# Patient Record
Sex: Female | Born: 1975
Health system: Southern US, Community
[De-identification: ages and names within clinical notes are randomized; demographics above are authoritative.]

## PROBLEM LIST (undated history)

## (undated) DIAGNOSIS — G4726 Circadian rhythm sleep disorder, shift work type: Secondary | ICD-10-CM

## (undated) DIAGNOSIS — M797 Fibromyalgia: Secondary | ICD-10-CM

## (undated) DIAGNOSIS — Z87440 Personal history of urinary (tract) infections: Secondary | ICD-10-CM

## (undated) DIAGNOSIS — R7611 Nonspecific reaction to tuberculin skin test without active tuberculosis: Secondary | ICD-10-CM

## (undated) DIAGNOSIS — E785 Hyperlipidemia, unspecified: Secondary | ICD-10-CM

## (undated) DIAGNOSIS — K760 Fatty (change of) liver, not elsewhere classified: Secondary | ICD-10-CM

## (undated) DIAGNOSIS — E119 Type 2 diabetes mellitus without complications: Secondary | ICD-10-CM

## (undated) DIAGNOSIS — D649 Anemia, unspecified: Secondary | ICD-10-CM

## (undated) HISTORY — DX: Anemia, unspecified: D64.9

## (undated) HISTORY — DX: Circadian rhythm sleep disorder, shift work type: G47.26

## (undated) HISTORY — PX: WISDOM TOOTH EXTRACTION: SHX21

## (undated) HISTORY — DX: Fatty (change of) liver, not elsewhere classified: K76.0

## (undated) HISTORY — DX: Personal history of urinary (tract) infections: Z87.440

## (undated) HISTORY — DX: Hyperlipidemia, unspecified: E78.5

## (undated) HISTORY — DX: Type 2 diabetes mellitus without complications: E11.9

## (undated) HISTORY — DX: Fibromyalgia: M79.7

## (undated) HISTORY — DX: Nonspecific reaction to tuberculin skin test without active tuberculosis: R76.11

---

## 2006-08-04 ENCOUNTER — Ambulatory Visit: Payer: Self-pay | Admitting: Pulmonary Disease

## 2006-08-04 LAB — CONVERTED CEMR LAB
ALT: 11 units/L (ref 0–40)
Alkaline Phosphatase: 34 units/L — ABNORMAL LOW (ref 39–117)
BUN: 4 mg/dL — ABNORMAL LOW (ref 6–23)
Basophils Absolute: 0 10*3/uL (ref 0.0–0.1)
Bilirubin, Direct: 0.2 mg/dL (ref 0.0–0.3)
Calcium: 9.2 mg/dL (ref 8.4–10.5)
Eosinophils Absolute: 0.2 10*3/uL (ref 0.0–0.6)
Eosinophils Relative: 4.5 % (ref 0.0–5.0)
GFR calc Af Amer: 126 mL/min
GFR calc non Af Amer: 104 mL/min
Ketones, ur: NEGATIVE mg/dL
Leukocytes, UA: NEGATIVE
MCHC: 33.7 g/dL (ref 30.0–36.0)
MCV: 85.4 fL (ref 78.0–100.0)
Monocytes Relative: 7 % (ref 3.0–11.0)
Platelets: 324 10*3/uL (ref 150–400)
Potassium: 4.1 meq/L (ref 3.5–5.1)
RBC: 4.07 M/uL (ref 3.87–5.11)
Total CHOL/HDL Ratio: 2.6
Triglycerides: 57 mg/dL (ref 0–149)
Urine Glucose: NEGATIVE mg/dL
WBC: 4.7 10*3/uL (ref 4.5–10.5)
pH: 6 (ref 5.0–8.0)

## 2007-03-05 ENCOUNTER — Emergency Department (HOSPITAL_COMMUNITY): Admission: EM | Admit: 2007-03-05 | Discharge: 2007-03-06 | Payer: Self-pay | Admitting: Emergency Medicine

## 2007-03-30 ENCOUNTER — Ambulatory Visit: Payer: Self-pay | Admitting: Pulmonary Disease

## 2007-03-30 LAB — CONVERTED CEMR LAB
Crystals: NEGATIVE
Ketones, ur: NEGATIVE mg/dL
Leukocytes, UA: NEGATIVE
Mucus, UA: NEGATIVE
Specific Gravity, Urine: 1.02 (ref 1.000–1.03)
Urine Glucose: NEGATIVE mg/dL
Urobilinogen, UA: 0.2 (ref 0.0–1.0)
pH: 5.5 (ref 5.0–8.0)

## 2007-03-31 DIAGNOSIS — Z87440 Personal history of urinary (tract) infections: Secondary | ICD-10-CM | POA: Insufficient documentation

## 2007-03-31 DIAGNOSIS — IMO0001 Reserved for inherently not codable concepts without codable children: Secondary | ICD-10-CM | POA: Insufficient documentation

## 2007-08-01 HISTORY — PX: OTHER SURGICAL HISTORY: SHX169

## 2007-08-25 ENCOUNTER — Encounter (INDEPENDENT_AMBULATORY_CARE_PROVIDER_SITE_OTHER): Payer: Self-pay | Admitting: Obstetrics & Gynecology

## 2007-08-25 ENCOUNTER — Inpatient Hospital Stay (HOSPITAL_COMMUNITY): Admission: RE | Admit: 2007-08-25 | Discharge: 2007-08-27 | Payer: Self-pay | Admitting: Obstetrics and Gynecology

## 2009-03-01 ENCOUNTER — Ambulatory Visit: Payer: Self-pay | Admitting: Pulmonary Disease

## 2009-03-01 DIAGNOSIS — G4726 Circadian rhythm sleep disorder, shift work type: Secondary | ICD-10-CM | POA: Insufficient documentation

## 2009-03-03 DIAGNOSIS — Z862 Personal history of diseases of the blood and blood-forming organs and certain disorders involving the immune mechanism: Secondary | ICD-10-CM | POA: Insufficient documentation

## 2009-03-03 DIAGNOSIS — R7309 Other abnormal glucose: Secondary | ICD-10-CM | POA: Insufficient documentation

## 2009-03-03 LAB — CONVERTED CEMR LAB
AST: 16 units/L (ref 0–37)
Albumin: 4.5 g/dL (ref 3.5–5.2)
Alkaline Phosphatase: 60 units/L (ref 39–117)
BUN: 10 mg/dL (ref 6–23)
Basophils Absolute: 0 10*3/uL (ref 0.0–0.1)
Basophils Relative: 0.5 % (ref 0.0–3.0)
CO2: 27 meq/L (ref 19–32)
Chloride: 104 meq/L (ref 96–112)
Cholesterol: 138 mg/dL (ref 0–200)
Creatinine, Ser: 0.8 mg/dL (ref 0.4–1.2)
Glucose, Bld: 95 mg/dL (ref 70–99)
HCT: 38 % (ref 36.0–46.0)
LDL Cholesterol: 76 mg/dL (ref 0–99)
Lymphocytes Relative: 41.6 % (ref 12.0–46.0)
MCHC: 32.8 g/dL (ref 30.0–36.0)
MCV: 87.8 fL (ref 78.0–100.0)
Monocytes Relative: 5.6 % (ref 3.0–12.0)
Neutro Abs: 3.1 10*3/uL (ref 1.4–7.7)
Neutrophils Relative %: 48.4 % (ref 43.0–77.0)
Platelets: 290 10*3/uL (ref 150.0–400.0)
Potassium: 4.4 meq/L (ref 3.5–5.1)
RDW: 12.9 % (ref 11.5–14.6)
Saturation Ratios: 10.8 % — ABNORMAL LOW (ref 20.0–50.0)
Total Protein: 7.8 g/dL (ref 6.0–8.3)
Triglycerides: 28 mg/dL (ref 0.0–149.0)

## 2009-05-17 ENCOUNTER — Encounter: Admission: RE | Admit: 2009-05-17 | Discharge: 2009-05-17 | Payer: Self-pay | Admitting: Internal Medicine

## 2009-12-30 ENCOUNTER — Ambulatory Visit (HOSPITAL_COMMUNITY): Admission: RE | Admit: 2009-12-30 | Discharge: 2009-12-30 | Payer: Self-pay | Admitting: Nephrology

## 2010-06-05 ENCOUNTER — Ambulatory Visit
Admission: RE | Admit: 2010-06-05 | Discharge: 2010-06-05 | Payer: Self-pay | Source: Home / Self Care | Attending: Pulmonary Disease | Admitting: Pulmonary Disease

## 2010-06-05 ENCOUNTER — Other Ambulatory Visit: Payer: Self-pay | Admitting: Pulmonary Disease

## 2010-06-05 ENCOUNTER — Encounter: Payer: Self-pay | Admitting: Pulmonary Disease

## 2010-06-05 DIAGNOSIS — R7611 Nonspecific reaction to tuberculin skin test without active tuberculosis: Secondary | ICD-10-CM | POA: Insufficient documentation

## 2010-06-05 LAB — CBC WITH DIFFERENTIAL/PLATELET
Basophils Absolute: 0 10*3/uL (ref 0.0–0.1)
Basophils Relative: 0.6 % (ref 0.0–3.0)
Eosinophils Absolute: 0.4 10*3/uL (ref 0.0–0.7)
Eosinophils Relative: 4.8 % (ref 0.0–5.0)
HCT: 36.5 % (ref 36.0–46.0)
Hemoglobin: 12.3 g/dL (ref 12.0–15.0)
Lymphocytes Relative: 38.7 % (ref 12.0–46.0)
Lymphs Abs: 2.9 10*3/uL (ref 0.7–4.0)
MCHC: 33.7 g/dL (ref 30.0–36.0)
MCV: 87.7 fl (ref 78.0–100.0)
Monocytes Absolute: 0.6 10*3/uL (ref 0.1–1.0)
Monocytes Relative: 8.6 % (ref 3.0–12.0)
Neutro Abs: 3.5 10*3/uL (ref 1.4–7.7)
Neutrophils Relative %: 47.3 % (ref 43.0–77.0)
Platelets: 318 10*3/uL (ref 150.0–400.0)
RBC: 4.16 Mil/uL (ref 3.87–5.11)
RDW: 13.5 % (ref 11.5–14.6)
WBC: 7.4 10*3/uL (ref 4.5–10.5)

## 2010-06-05 LAB — LIPID PANEL
Cholesterol: 127 mg/dL (ref 0–200)
HDL: 46.1 mg/dL (ref >39.00)
LDL Cholesterol: 67 mg/dL (ref 0–99)
Total CHOL/HDL Ratio: 3
Triglycerides: 70 mg/dL (ref 0.0–149.0)
VLDL: 14 mg/dL (ref 0.0–40.0)

## 2010-06-05 LAB — URINALYSIS, ROUTINE W REFLEX MICROSCOPIC
Bilirubin Urine: NEGATIVE
Ketones, ur: NEGATIVE
Leukocytes, UA: NEGATIVE
Nitrite: NEGATIVE
Specific Gravity, Urine: 1.025 (ref 1.000–1.030)
Total Protein, Urine: NEGATIVE
Urine Glucose: NEGATIVE
Urobilinogen, UA: 0.2 (ref 0.0–1.0)
pH: 6 (ref 5.0–8.0)

## 2010-06-05 LAB — BASIC METABOLIC PANEL
BUN: 11 mg/dL (ref 6–23)
CO2: 25 mEq/L (ref 19–32)
Calcium: 10.1 mg/dL (ref 8.4–10.5)
Chloride: 107 mEq/L (ref 96–112)
Creatinine, Ser: 0.7 mg/dL (ref 0.4–1.2)
GFR: 125.12 mL/min (ref >60.00)
Glucose, Bld: 72 mg/dL (ref 70–99)
Potassium: 4.1 mEq/L (ref 3.5–5.1)
Sodium: 139 mEq/L (ref 135–145)

## 2010-06-05 LAB — TSH: TSH: 0.59 u[IU]/mL (ref 0.35–5.50)

## 2010-06-05 LAB — HEPATIC FUNCTION PANEL
ALT: 18 U/L (ref 0–35)
AST: 21 U/L (ref 0–37)
Albumin: 4 g/dL (ref 3.5–5.2)
Alkaline Phosphatase: 68 U/L (ref 39–117)
Bilirubin, Direct: 0.1 mg/dL (ref 0.0–0.3)
Total Bilirubin: 0.5 mg/dL (ref 0.3–1.2)
Total Protein: 7.5 g/dL (ref 6.0–8.3)

## 2010-06-11 ENCOUNTER — Telehealth (INDEPENDENT_AMBULATORY_CARE_PROVIDER_SITE_OTHER): Payer: Self-pay | Admitting: *Deleted

## 2010-07-04 NOTE — Progress Notes (Signed)
Summary: copy of cxr notes  Phone Note Call from Patient   Caller: Patient Call For: nadel Summary of Call: pt wants a copy of cxr results. she wants to pick this up today. is this ok for me to print this out for pt to pick up today? pt # [id number] Initial call taken by: Tivis Ringer, CNA,  June 11, 2010 2:27 PM  Follow-up for Phone Call        Spoke with pt and notified copy of her cxr report is up front for pick up.  Follow-up by: Vernie Murders,  June 11, 2010 3:03 PM

## 2010-07-04 NOTE — Assessment & Plan Note (Signed)
Summary: physical ///kp   CC:  15 month ROV & CPX....  History of Present Illness: 35 y/o BF here for a follow up visit and CPX... she enjoys excellent general medical health & has no new complaints or concerns today... 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   Current Problems:   PHYSICAL EXAMINATION (ICD-V70.0) - she is a Dialysis nurse at [employer]- she has had the HepB vaccine, neg PPD's, Tetanus shot 1994 & 9/10... her GYN is DrNeal & she is up to date on Paps etc... she had surg 2009 for benign cystic teratomas of right ovary (2 separate lesions), uterine fibroids, follicular cyst of left ovary.   NONSPEC REACT TUBERCULIN SKIN TEST W/O ACTIVE TB (ICD-795.51) - they reported pos PPD from [facility] w/ CXRs all neg...  ~  CXR 3/08 showed clear, NAD.Marland Kitchen.  ~  CXR 7/11 at [facility] was clear, WNL.Marland KitchenMarland Kitchen  Hx of OTHER ABNORMAL GLUCOSE (ICD-790.29) - prev BS in the 100-115 range...  ~  labs 9/10 showed BS= 95... on diet alone.  ~  labs 1/12 showed FBS=   Hx GYN surg 3/09 by DrNeal> benign cystic teratomas of right ovary (2 separate lesions), uterine fibroids, follicular cyst of left ovary.   HX, URINARY INFECTION (ICD-V13.02) - no recent UTI symptoms, doing well w/o burning, incontinence, etc...  ? of FIBROMYALGIA (ICD-729.1) - her energy is good, resting satis most nights, wakes refreshed, & denies recent muscle symptoms etc...  CIRCADIAN RHYTHM SLEEP DISORDER SHIFT WORK TYPE (ICD-327.36) ** see above **  ANEMIA, MILD, HX OF (ICD-V12.3) - Hg in the 11 range prev...  ~  labs 3/08 showed Hg= 11.7  ~  labs 9/10 showed Hg= 12.4, Fe= 48... rec> OTC FeSO4.  ~  labs 1/12 showed Hg=    Preventive Screening-Counseling & Management  Alcohol-Tobacco     Smoking Status: never  Allergies (verified): No Known Drug Allergies  Comments:  Nurse/Medical Assistant: The patient's medications  and allergies were reviewed with the patient and were updated in the Medication and Allergy Lists.  Past History:  Past Medical History: NONSPEC REACT TUBERCULIN SKIN TEST W/O ACTIVE TB (ICD-795.51) Hx of OTHER ABNORMAL GLUCOSE (ICD-790.29) HX, URINARY INFECTION (ICD-V13.02) FIBROMYALGIA (ICD-729.1) CIRCADIAN RHYTHM SLEEP DISORDER SHIFT WORK TYPE (ICD-327.36) ANEMIA, MILD, HX OF (ICD-V12.3)  Past Surgical History: S/P GYN surg 3/09 by Latanya Presser for benign cystic teratomas of right ovary (2 separate lesions), uterine fibroids, follicular cyst of left ovary.   Family History: Reviewed history from 03/01/2009 and no changes required. Father alive age 62 w/ hx HBP Mother alive age 42 in good health 1 Sibling- Sister age 95 in good health  Social History: Reviewed history from 03/01/2009 and no changes required. Single No children Never smoked Social Etoh Dialysis nurse at [employer] [facility] [employer] [employer] [facility]  Review of Systems  The patient denies fever, chills, sweats, anorexia, fatigue, weakness, malaise, weight loss, sleep disorder, blurring, diplopia, eye irritation, eye discharge, vision loss, eye pain, photophobia, earache, ear discharge, tinnitus, decreased hearing, nasal congestion, nosebleeds, sore throat, hoarseness, chest pain, palpitations, syncope, dyspnea on exertion, orthopnea, PND, peripheral edema, cough, dyspnea at rest, excessive sputum, hemoptysis, wheezing, pleurisy, nausea, vomiting, diarrhea, constipation, change in bowel habits, abdominal pain, melena, hematochezia, jaundice, gas/bloating, indigestion/heartburn, dysphagia, odynophagia, dysuria, hematuria, urinary frequency, urinary hesitancy, nocturia, incontinence, back pain, joint pain, joint swelling, muscle cramps, muscle weakness, stiffness, arthritis, sciatica,  restless legs, leg pain at night, leg pain with exertion, rash, itching, dryness, suspicious lesions, paralysis, paresthesias, seizures, tremors, vertigo, transient blindness,  frequent falls, frequent headaches, difficulty walking, depression, anxiety, memory loss, confusion, cold intolerance, heat intolerance, polydipsia, polyphagia, polyuria, unusual weight change, abnormal bruising, bleeding, enlarged lymph nodes, urticaria, allergic rash, hay fever, and recurrent infections.    Vital Signs:  Patient profile:   35 year old female Height:      59 inches Weight:      144.38 pounds BMI:     29.27 O2 Sat:      100 % on Room air Temp:     98.6 degrees F oral Pulse rate:   117 / minute BP sitting:   122 / 72  (left arm) Cuff size:   regular  Vitals Entered By: Randell Loop CMA (June 05, 2010 11:36 AM)  O2 Sat at Rest %:  100 O2 Flow:  Room air CC: 15 month ROV & CPX... Is Patient Diabetic? No Pain Assessment Patient in pain? no      Comments meds updated today with pt   Physical Exam  Additional Exam:  WD, WN, 34 y/o BF in NAD... GENERAL:  Alert & oriented; pleasant & cooperative... HEENT:  Tatamy/AT, EOM-wnl, PERRLA, Fundi-benign, EACs-clear, TMs-wnl, NOSE-clear, THROAT-clear & wnl. NECK:  Supple w/ full ROM; no JVD; normal carotid impulses w/o bruits; no thyromegaly or nodules palpated; no lymphadenopathy. CHEST:  Clear to P & A; without wheezes/ rales/ or rhonchi. HEART:  Regular Rhythm; without murmurs/ rubs/ or gallops. ABDOMEN:  Soft & nontender; normal bowel sounds; no organomegaly or masses detected. EXT: without deformities or arthritic changes; no varicose veins/ venous insuffic/ or edema. NEURO:  CN's intact; motor testing normal; sensory testing normal; gait normal & balance OK. DERM:  No lesions noted; no rash etc...    CXR  Procedure date:  12/30/2009  Findings:      Clinical Data: Positive PPD.    CHEST - 2 VIEW   Comparison: None.    Findings: Heart and mediastinal contours are within normal limits.   No focal opacities or effusions.  No acute bony abnormality.    IMPRESSION:   Normal study.    Read By:  Charlett Nose,   M.D.   EKG  Procedure date:  06/05/2010  Findings:      Normal sinus rhythm with rate of:  100/min... Tracing is WNL, NAD...  SN   MISC. Report  Procedure date:  06/05/2010  Findings:      BMP (METABOL)   Sodium                    139 mEq/L                   135-145   Potassium                 4.1 mEq/L                   3.5-5.1   Chloride                  107 mEq/L                   96-112   Carbon Dioxide            25 mEq/L                    19-32   Glucose  72 mg/dL                    56-21   BUN                       11 mg/dL                    3-08   Creatinine                0.7 mg/dL                   6.5-7.8   Calcium                   10.1 mg/dL                  4.6-96.2   GFR                       125.12 mL/min               >60.00  Hepatic/Liver Function Panel (HEPATIC)   Total Bilirubin           0.5 mg/dL                   9.5-2.8   Direct Bilirubin          0.1 mg/dL                   4.1-3.2   Alkaline Phosphatase      68 U/L                      39-117   AST                       21 U/L                      0-37   ALT                       18 U/L                      0-35   Total Protein             7.5 g/dL                    4.4-0.1   Albumin                   4.0 g/dL                    0.2-7.2  CBC Platelet w/Diff (CBCD)   White Cell Count          7.4 K/uL                    4.5-10.5   Red Cell Count            4.16 Mil/uL                 3.87-5.11   Hemoglobin                12.3 g/dL                   53.6-64.4   Hematocrit  36.5 %                      36.0-46.0   MCV                       87.7 fl                     78.0-100.0   Platelet Count            318.0 K/uL                  150.0-400.0   Neutrophil %              47.3 %                      43.0-77.0   Lymphocyte %              38.7 %                      12.0-46.0   Monocyte %                8.6 %                       3.0-12.0   Eosinophils%               4.8 %                       0.0-5.0   Basophils %               0.6 %                       0.0-3.0  Comments:      Lipid Panel (LIPID)   Cholesterol               127 mg/dL                   0-981   Triglycerides             70.0 mg/dL                  1.9-147.8   HDL                       29.56 mg/dL                 >21.30   LDL Cholesterol           67 mg/dL                    8-65  TSH (TSH)   FastTSH                   0.59 uIU/mL                 0.35-5.50   UDip w/Micro (URINE)   Color                     LT. YELLOW   Clarity                   CLEAR                       Clear  Specific Gravity          1.025                       1.000 - 1.030   Urine Ph                  6.0                         5.0-8.0   Protein                   NEGATIVE                    Negative   Urine Glucose             NEGATIVE                    Negative   Ketones                   NEGATIVE                    Negative   Urine Bilirubin           NEGATIVE                    Negative   Blood                     MODERATE                    Negative   Urobilinogen              0.2                         0.0 - 1.0   Leukocyte Esterace        NEGATIVE                    Negative   Nitrite                   NEGATIVE                    Negative   Urine RBC                 3-6/hpf                     0-2/hpf   Urine Mucus               Presence of                 None   Urine Epith               Rare(0-4/hpf)               Rare(0-4/hpf)   Impression & Recommendations:  Problem # 1:  PHYSICAL EXAMINATION (ICD-V70.0)  Orders: 12 Lead EKG (12 Lead EKG) T-2 View CXR (71020TC) TLB-BMP (Basic Metabolic Panel-BMET) (80048-METABOL) TLB-Hepatic/Liver Function Pnl (80076-HEPATIC) TLB-CBC Platelet - w/Differential (85025-CBCD) TLB-Lipid Panel (80061-LIPID) TLB-TSH (Thyroid Stimulating Hormone) (84443-TSH) TLB-Udip w/ Micro (81001-URINE)  Problem # 2:  NONSPEC REACT TUBERCULIN SKIN TEST W/O ACTIVE TB  (ICD-795.51) She had ?pos PPD at work & neg CXR 7/11 as above & repeat CXR today remains clear.Marland KitchenMarland Kitchen  Problem # 3:  Hx of OTHER ABNORMAL GLUCOSE (ICD-790.29) Prev hx borderline BS>  all labs WNL since then & this is not a problem... we discussed diet + exercise...  Problem # 4:  HX, URINARY INFECTION (ICD-V13.02) She is s/p GYN surg by DrNeal in 2009... no urinary symptoms at present...  Problem # 5:  CIRCADIAN RHYTHM SLEEP DISORDER SHIFT WORK TYPE (ICD-327.36) She works 3rd shifts at [employer]... AMBIEN 10mg  Prn helps...  Problem # 6:  ANEMIA, MILD, HX OF (ICD-V12.3) We discussed OTC iron supplementation.. current Hg= 12.3, MCV= 88...  Complete Medication List: 1)  Loestrin 24 Fe 1-20 Mg-mcg Tabs (Norethin ace-eth estrad-fe) .... Take 1 tablet by mouth once a day 2)  Ambien 10 Mg Tabs (Zolpidem tartrate) .... Take 1/2 to 1 tab by mouth at bedtime as needed for sleep...  Patient Instructions: 1)  Today we updated your med list- see below.... 2)  We refilled your Ambien/ Zolpidem as requested... 3)  Today we did your follow up CXR, EKG, & FASTING blood work... please call the "phone tree" in a few days for your lab results.Marland KitchenMarland Kitchen 4)  Call for any problems.Marland KitchenMarland Kitchen 5)  Please schedule a follow-up appointment in 1 year. Prescriptions: AMBIEN 10 MG TABS (ZOLPIDEM TARTRATE) take 1/2 to 1 tab by mouth at bedtime as needed for sleep...  #30 x prn   Entered and Authorized by:   Michele Mcalpine MD   Signed by:   Michele Mcalpine MD on 06/05/2010   Method used:   Print then Give to Patient   RxID:   [id number]    Immunization History:  Influenza Immunization History:    Influenza:  historical (04/15/2010)

## 2010-10-15 NOTE — Discharge Summary (Signed)
Rose Hansen, Rose Hansen            ACCOUNT NO.:  [id number]   MEDICAL RECORD NO.:  [id number]          PATIENT TYPE:  INP   LOCATION:  9313                          FACILITY:  [facility]   PHYSICIAN:  Freddy Finner, M.D.   DATE OF BIRTH:  Aug 05, 1975   DATE OF ADMISSION:  08/25/2007  DATE OF DISCHARGE:  08/27/2007                               DISCHARGE SUMMARY   DISCHARGE DIAGNOSES:  1. Benign cystic teratomas of right ovary, 2 separate lesions.  2. Uterine fibroids.  3. Follicular cyst of left ovary.  4. Vaginal introitus with fibrous hymenal ring.   PROCEDURE:  Exploratory laparotomy, resection of 2 dermoid cysts of  right ovary, inspection and drainage of follicular left ovarian cyst,  and hymenotomy.   INTRAOPERATIVE AND POSTOPERATIVE COMPLICATIONS:  None.   DISPOSITION:  The patient was in satisfactory improved condition at the  time of her discharge.  She is to have progressively increase in  physical activity, but no heavy lifting and no vaginal entry.  She is to  call for fever, for severe pain, or heavy bleeding.  She is to return to  the office in approximately 1 week for her first postoperative visit.  She is to take a regular diet.  She was given Percocet with Vicodin as  needed for postoperative pain along with Motrin.  She can also use  Zofran oral dissolving tablets 4 mg every 4-6 hours as needed for  nausea.   Details of the present illness are recorded in the admission note.  Briefly, the patient was found to have an adnexal mass consistent with  dermoid cyst of the ovary and is admitted now for surgery.  She had a  further complaint of narrowing of vaginal introitus and requested  hymenotomy.  Her physical findings were otherwise remarkable only for  uterine fibroids.   LABORATORY DATA:  During this admission includes normal hemoglobin of  12.0 on admission and normal prothrombin time and PTT.  Postoperative  hemoglobin was 10.6.   HOSPITAL COURSE:  The patient  was admitted on the morning of surgery.  The above-described operating procedure was accomplished without  significant difficulty or any intraoperative complications.  Postoperatively, her course was unremarkable.  She remained afebrile  throughout the stay.  By morning of the second postoperative day, she  was tolerating regular diet.  She was ambulating without assistance.  She was having adequate bowel and bladder function.  Her incision was  clean, dry, intact, and healing well.  The skin staples were removed.  She was discharged home with disposition as noted above.      Freddy Finner, M.D.  Electronically Signed    WRN/MEDQ  D:  08/27/2007  T:  08/28/2007  Job:  [id number]

## 2010-10-15 NOTE — Op Note (Signed)
Rose Hansen, TORREZ NO.:  [id number]   MEDICAL RECORD NO.:  [id number]           PATIENT TYPE:   LOCATION:                                 FACILITY:   PHYSICIAN:  Freddy Finner, M.D.        DATE OF BIRTH:   DATE OF PROCEDURE:  08/25/2007  DATE OF DISCHARGE:                               OPERATIVE REPORT   PREOPERATIVE DIAGNOSES:  Complex right ovarian cyst with solid-appearing  component in the base of the cyst.  Virginal introitus with fibrous  thickening of hymen.  Uterine leiomyomata   POSTOPERATIVE DIAGNOSES:  Two dermoid cysts of right ovary, one filled  with air, one more solid.  Both appeared to be benign to gross  inspection by the pathologist.   SECONDARY DIAGNOSES:  Follicular cyst of left ovary.  Uterine  leiomyomata, subserosal/intramural.  Fibrous thickening of hymeneal  ring.  Appendicolith with otherwise normal appearing appendix.  The  appendicolith was manually expressed back into the cecum.   OPERATIVE PROCEDURE:  Incision and drainage of left follicular ovarian  cyst.  Right ovarian cystectomies with resection of both masses.  Hymenotomy.   SURGEON:  Freddy Finner, M.D.   ASSISTANT:  Miguel Aschoff, M.D.   ANESTHESIA:  General endotracheal.   ESTIMATED INTRAOPERATIVE BLOOD LOSS:  50 mL.   INTRAOPERATIVE COMPLICATIONS:  None.   DETAILS OF THE PRESENT ILLNESS:  Are recorded in the admission note.  The patient was admitted on the morning of surgery.  She was given a  bolus of Ancef IV preoperatively.  She was placed in PAS hose.  She was  brought to the operating room, placed under adequate general  endotracheal anesthesia, placed in the dorsal lithotomy position using  Allen stirrups with them in down position.  The abdomen, perineum, and  vagina were prepped in the usual fashion with scrub followed by a  solution.  Foley catheter was placed using sterile technique.  Sterile  drapes were applied.  A lower abdominal transverse  incision was made  sharply and extended sharply to the fascia.  Subcutaneous vessels were  controlled with the Bovie.  The fascia was entered sharply in a  transverse direction and extended with Mayo scissors to the extent of  the skin incision.  Rectus sheath was developed superiorly and  inferiorly with blunt and sharp dissection.  Rectus muscles were divided  in the midline.  Peritoneum was elevated, entered sharply.  Peritoneal  washings were taken but later discarded after benign initial diagnosis.  Peritoneal incision was extended to the external skin incision.  The  uterus was palpated, and two myomas were noted consistent with the  ultrasound findings measuring less than 2 cm each.  These were both on  the posterior fundal surface and were left in place.  The uterus was  otherwise palpably normal.  The left adnexa was elevated.  Fallopian  tube was normal.  There was follicular-appearing clear cyst on the left  which was incised and drained with a scalpel blade.  The right ovary was  elevated and was cystically enlarged.  Hair appeared to  be growing  through the ovarian cortex.  The ovary was elevated above the incision  line and carefully wrapped with sloppy wet packs.  An incision was made  over the cyst with the scalpel.  Using the Metzenbaum and careful blunt  and sharp dissection, the cyst was dissected free.  It measured  approximately 5 cm x 4 cm x 4 cm.  The cyst was incised in a pan to  isolate from the operative field, and it was filled with hair.  Palpation of the other ovary revealed an approximately 1 cm x 6 or 8 mm  in thickness firm mass in the ovary beneath the primary cyst.  This was  also carefully dissected out.  Small segments of cortex were included  with both cysts.  They were sent to [facility] for inspection and  possible frozen section.  The defect in the ovary was then closed with a  running locking 3-0 Monocryl in a baseball-stitch pattern.  The same   suture was also then used to suture intercede over the incision line to  reduce postoperative adhesions.  Hemostasis was complete.  Manual  exploration of the upper abdomen was then carried out.  Both kidneys  were palpable and felt to be normal.  The appendix was visualized.  There was an appendicolith in the appendix which was easily reduced.  It  was otherwise completely normal.  There was no palpable abnormality of  the liver edge or gallbladder.  Irrigation was carried out.  All pack,  needle, and instrument counts were correct.  Abdominal incision was  closed in layers.  Running 0 Monocryl was used to close the peritoneum  and reapproximate the rectus muscles.  Fascia was closed with double  looped 0 PDS running from angle to angle on either side.  Subcutaneous  tissue was approximated with a running 2-0 plain.  Skin was closed with  Pott's skin staples and quarter-inch Steri-Strips.  Sterile dressing was  applied.  Attention was then turned to the vaginal introitus.  The  hymenal ring was identified.  The fourchette was grasped with Allis's.  Longitudinal incision in the long axis of the body was made through the  hymeneal ring at 4 and 8 o'clock.  It was sutured back in the midline of  the incision from superior to inferior to widen the vaginal introitus by  this amount and to reduce the probability of hymeneal rupture at another  time.  A small abrasion was encountered at approximately 6 o'clock just  from digital manipulation of the hymeneal ring.  A small amount of  bleeding was controlled with the Bovie.  A 4-0 Dexon suture was used to  suture the defects created by the hymenotomy.  Hemostasis at this level  was also adequate.  Foley catheter was left indwelling.  The patient was  awakened and taken to the recovery room in good condition.      Freddy Finner, M.D.  Electronically Signed     WRN/MEDQ  D:  08/25/2007  T:  08/25/2007  Job:  [id number]

## 2010-10-15 NOTE — H&P (Signed)
Rose Hansen, COUNTESS            ACCOUNT NO.:  [id number]   MEDICAL RECORD NO.:  [id number]          PATIENT TYPE:  AMB   LOCATION:  SDC                           FACILITY:  [facility]   PHYSICIAN:  Freddy Finner, M.D.   DATE OF BIRTH:  [birth year]   DATE OF ADMISSION:  08/25/2007  DATE OF DISCHARGE:                              HISTORY & PHYSICAL   ADMITTING DIAGNOSIS:  Probable dermoid cyst of the right ovary.   The patient is a 35 year old black single female, nulligravida, who was  found on CT of the abdomen and pelvis with Dr. Bjorn Pippin to have a  right adnexal pelvic mass which seemed most consistent with an ovarian  dermoid.  Ultrasound in our office confirmed the presence of an adnexal  mass measuring approximately 6 cm in diameter.  It had solid and cystic  components.  A CA-125 obtained in our office was normal at 5.5.  In  addition to the pelvic mass, the patient has a virginal introitus which  is likely to interfere with her intended marriage in the near future.  She has requested a hymenotomy to preclude significant difficulties with  this.   Her current review of systems is negative.  There are no  cardiopulmonary, GI, or other GU complaints.   PAST MEDICAL HISTORY:  The patient has no known significant medical  illnesses.   She has no known allergies to medications.   Her only current medication is contraceptive patch.   She has had no previous history of surgical procedures.  She has never  had a blood transfusion.  She does not use cigarettes or alcohol.   PHYSICAL EXAMINATION:  HEENT:  Grossly within normal limits.  Thyroid  gland is not palpably enlarged to my exam.  VITAL SIGNS:  Her blood pressure in the office 120/68.  CHEST:  Clear to auscultation throughout.  HEART:  Normal sinus rhythm without murmur, rubs, or gallops.  BREASTS:  Exam was normal.  No palpable masses, no nipple discharge or  skin change.  ABDOMEN:  Soft and nontender without appreciable  organomegaly or  palpable masses.  There is no CVA tenderness.  GU:  Bimanual pelvic exam was compromised by guarding and by the  virginal state of the patient.  She has a recent Pap test which was  normal.  The ultrasound findings are most appropriate for defining the  issues on admission and these include the right pelvic mass and some  small intramural leiomyomata.   ASSESSMENT:  1. Dermoid cyst of right ovary.  2. Thickened hymeneal ring.   PLAN:  Exploratory laparotomy with right ovarian cystectomy and  hymenotomy.  The potential risks of the procedure including injury to  other organs, infection and hemorrhage, and the possibility of having to  remove the right ovary have been discussed with her.  She is prepared to  proceed with surgery.      Freddy Finner, M.D.  Electronically Signed     WRN/MEDQ  D:  08/24/2007  T:  08/24/2007  Job:  [id number]

## 2010-12-10 ENCOUNTER — Other Ambulatory Visit: Payer: Self-pay | Admitting: *Deleted

## 2010-12-10 MED ORDER — ZOLPIDEM TARTRATE 10 MG PO TABS: 10.0000 mg | ORAL_TABLET | Freq: Every evening | ORAL | Status: DC | PRN

## 2011-02-24 LAB — CBC
HCT: 30.8 — ABNORMAL LOW
HCT: 35.4 — ABNORMAL LOW
Hemoglobin: 10.6 — ABNORMAL LOW
Hemoglobin: 12
RBC: 3.59 — ABNORMAL LOW
RDW: 13
RDW: 13

## 2011-02-24 LAB — APTT: aPTT: 28

## 2011-03-13 LAB — COMPREHENSIVE METABOLIC PANEL
ALT: 13
AST: 15
Alkaline Phosphatase: 40
CO2: 22
GFR calc non Af Amer: >60
Glucose, Bld: 111 — ABNORMAL HIGH
Potassium: 3.8
Sodium: 139

## 2011-03-13 LAB — DIFFERENTIAL
Basophils Absolute: 0
Eosinophils Absolute: 0
Eosinophils Relative: 1
Lymphs Abs: 1.4
Monocytes Absolute: 0.1 — ABNORMAL LOW

## 2011-03-13 LAB — CBC
Platelets: 310
RBC: 4.03
WBC: 9.9

## 2011-03-13 LAB — URINE MICROSCOPIC-ADD ON

## 2011-03-13 LAB — URINALYSIS, ROUTINE W REFLEX MICROSCOPIC
Glucose, UA: NEGATIVE
Protein, ur: NEGATIVE
Specific Gravity, Urine: 1.034 — ABNORMAL HIGH
pH: 5.5

## 2011-05-30 ENCOUNTER — Other Ambulatory Visit: Payer: Self-pay | Admitting: Pulmonary Disease

## 2011-05-30 MED ORDER — ZOLPIDEM TARTRATE 10 MG PO TABS: 10.0000 mg | ORAL_TABLET | Freq: Every evening | ORAL | Status: DC | PRN

## 2011-05-30 NOTE — Telephone Encounter (Signed)
RX called to the pharmacy

## 2011-06-11 ENCOUNTER — Ambulatory Visit (INDEPENDENT_AMBULATORY_CARE_PROVIDER_SITE_OTHER): Payer: Managed Care, Other (non HMO) | Admitting: Pulmonary Disease

## 2011-06-11 ENCOUNTER — Other Ambulatory Visit (INDEPENDENT_AMBULATORY_CARE_PROVIDER_SITE_OTHER): Payer: Self-pay

## 2011-06-11 ENCOUNTER — Encounter: Payer: Self-pay | Admitting: Pulmonary Disease

## 2011-06-11 VITALS — BP 110/72 | HR 108 | Temp 97.7°F | Ht 59.0 in | Wt 127.4 lb

## 2011-06-11 DIAGNOSIS — Z862 Personal history of diseases of the blood and blood-forming organs and certain disorders involving the immune mechanism: Secondary | ICD-10-CM

## 2011-06-11 DIAGNOSIS — Z0000 Encounter for general adult medical examination without abnormal findings: Secondary | ICD-10-CM

## 2011-06-11 DIAGNOSIS — R7611 Nonspecific reaction to tuberculin skin test without active tuberculosis: Secondary | ICD-10-CM

## 2011-06-11 DIAGNOSIS — G4726 Circadian rhythm sleep disorder, shift work type: Secondary | ICD-10-CM

## 2011-06-11 LAB — CBC WITH DIFFERENTIAL/PLATELET
Basophils Absolute: 0.1 10*3/uL (ref 0.0–0.1)
Basophils Relative: 1.6 % (ref 0.0–3.0)
Eosinophils Absolute: 0.2 10*3/uL (ref 0.0–0.7)
Eosinophils Relative: 2.3 % (ref 0.0–5.0)
HCT: 38.3 % (ref 36.0–46.0)
Hemoglobin: 13 g/dL (ref 12.0–15.0)
Lymphocytes Relative: 43.4 % (ref 12.0–46.0)
Lymphs Abs: 3.2 10*3/uL (ref 0.7–4.0)
MCHC: 34 g/dL (ref 30.0–36.0)
MCV: 89.1 fl (ref 78.0–100.0)
Monocytes Absolute: 0.4 10*3/uL (ref 0.1–1.0)
Monocytes Relative: 5.1 % (ref 3.0–12.0)
Neutro Abs: 3.5 10*3/uL (ref 1.4–7.7)
Neutrophils Relative %: 47.6 % (ref 43.0–77.0)
Platelets: 368 10*3/uL (ref 150.0–400.0)
RBC: 4.29 Mil/uL (ref 3.87–5.11)
RDW: 13.1 % (ref 11.5–14.6)
WBC: 7.4 10*3/uL (ref 4.5–10.5)

## 2011-06-11 LAB — HEPATIC FUNCTION PANEL
ALT: 9 U/L (ref 0–35)
AST: 14 U/L (ref 0–37)
Alkaline Phosphatase: 52 U/L (ref 39–117)
Total Bilirubin: 1 mg/dL (ref 0.3–1.2)

## 2011-06-11 LAB — IBC PANEL
Saturation Ratios: 13.6 % — ABNORMAL LOW (ref 20.0–50.0)
Transferrin: 304.5 mg/dL (ref 212.0–360.0)

## 2011-06-11 LAB — BASIC METABOLIC PANEL
BUN: 10 mg/dL (ref 6–23)
Chloride: 108 mEq/L (ref 96–112)
GFR: 149.01 mL/min (ref >60.00)
Potassium: 4.1 mEq/L (ref 3.5–5.1)
Sodium: 140 mEq/L (ref 135–145)

## 2011-06-11 LAB — IRON: Iron: 58 ug/dL (ref 42–145)

## 2011-06-11 LAB — LIPID PANEL
Cholesterol: 128 mg/dL (ref 0–200)
LDL Cholesterol: 68 mg/dL (ref 0–99)
VLDL: 7.8 mg/dL (ref 0.0–40.0)

## 2011-06-11 LAB — TSH: TSH: 0.65 u[IU]/mL (ref 0.35–5.50)

## 2011-06-11 MED ORDER — ISONIAZID 300 MG PO TABS: 300.0000 mg | ORAL_TABLET | Freq: Every day | ORAL | Status: AC

## 2011-06-11 MED ORDER — ZOLPIDEM TARTRATE 10 MG PO TABS: 10.0000 mg | ORAL_TABLET | Freq: Every evening | ORAL | Status: DC | PRN

## 2011-06-11 NOTE — Patient Instructions (Signed)
Today we updated your med list in our EPIC system...    Continue your current medications the same...    We refilled your Ambien per request...  We discussed starting in ISONIAZID (INH) for your positive PPD...    Take one tab daily... If you develop nausea/ vomiting- stop the med and call me.     I also rec that you take a BComplex multivit, & an OTC iron tab...  Today we did your follow up CXR & fasting blood work...    Please call the PHONE TREE in a few days for your results...    Dial [id number] & when prompted enter your patient number followed by the # symbol...    Your patient number is:  [id number]  Call for any questions...  let's plan a brief follow up on the new med in about 6-8 weeks.Marland KitchenMarland Kitchen

## 2011-06-12 ENCOUNTER — Ambulatory Visit (HOSPITAL_COMMUNITY)
Admission: RE | Admit: 2011-06-12 | Discharge: 2011-06-12 | Disposition: A | Discharge status: Home / Self Care | Payer: Managed Care, Other (non HMO) | Source: Ambulatory Visit | Attending: Pulmonary Disease | Admitting: Pulmonary Disease

## 2011-06-12 DIAGNOSIS — Z0000 Encounter for general adult medical examination without abnormal findings: Secondary | ICD-10-CM | POA: Insufficient documentation

## 2011-07-11 ENCOUNTER — Encounter: Payer: Self-pay | Admitting: Pulmonary Disease

## 2011-07-11 NOTE — Progress Notes (Signed)
Subjective:     Patient ID: Rose Hansen, female   DOB: [birth year], 36 y.o.   MRN: [id number]  HPI 36 y/o BF here for a follow up visit and CPX... she enjoys excellent general medical health & has no new complaints or concerns today... 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  ~  June 11, 2011:  Yearly ROV & CPX> Rose Hansen reports a good year & as no new complaints or concerns... She converted her PPD in 2010 by her hx & was seen by employee health at [facility]- no rec for treatment; as she was just [age] at the time & a recent converter I feel she should have been treated w/ INH> we discussed this & decided to start 22yr worth of INH treatment 300mg /d + BVits daily...    See below for f/u CXR & FASTING blood work >> FLP looks great on diet alone...          Problem List:   PHYSICAL EXAMINATION (ICD-V70.0) - she is a Dialysis nurse at [employer]- she has had the HepB vaccine, neg PPD's, Tetanus shot 1994 & 9/10... her GYN is DrNeal & she is up to date on Paps etc... she had surg 2009 for benign cystic teratomas of right ovary (2 separate lesions), uterine fibroids, follicular cyst of left ovary.   NONSPEC REACT TUBERCULIN SKIN TEST W/O ACTIVE TB (ICD-795.51) - they reported pos PPD from [facility] in 2010 at age 35-33 w/ CXRs all neg; they did not embark on a treatment course of INH so we discussed starting this 1/13 for 16yr Rx... ~  CXR 3/08 showed clear, NAD... ~  2010:  She reports that she converted her PPD to pos, never took INH, CXR's have all been neg... ~  CXR 7/11 at [facility] was clear, WNL.Marland Kitchen. ~  CXR 1/12 showed clear lungs, normal heart size, WNL.Marland Kitchen. ~  CXR 1/13 showed clear lungs, WNL (copy mailed to pt for her work); we discussed starting INH 300mg /d prophylaxis...  Hx of OTHER ABNORMAL GLUCOSE (ICD-790.29) - prev BS in the 100-115 range... ~  labs 9/10 showed BS= 95...  on diet alone. ~  labs 1/12 showed FBS= 72 ~  Labs 1/13 showed FBS= 83  Hx GYN surg 3/09 by DrNeal> benign cystic teratomas of right ovary (2 separate lesions), uterine fibroids, follicular cyst of left ovary.   HX, URINARY INFECTION (ICD-V13.02) - no recent UTI symptoms, doing well w/o burning, incontinence, etc...  ? of FIBROMYALGIA (ICD-729.1) - her energy is good, resting satis most nights, wakes refreshed, & denies recent muscle symptoms etc...  CIRCADIAN RHYTHM SLEEP DISORDER SHIFT WORK TYPE (ICD-327.36) ** see above **  ANEMIA, MILD, HX OF (ICD-V12.3) - Hg in the 11 range prev... ~  labs 3/08 showed Hg= 11.7 ~  labs 9/10 showed Hg= 12.4, Fe= 48... rec> OTC FeSO4. ~  labs 1/12 showed Hg= 12.3, MCV= 88 ~  Labs 1/13 showed Hg= 13.0, MCV= 89, Fe= 58 (14%)   Past Surgical History  Procedure Date  . Gyn surgery 08/2007    Dr. Jennette Kettle for benign cystic teratomas of right ovary,uterine fibroids, follicular cyst of left ovary    Outpatient Encounter Prescriptions as of 06/11/2011  Medication Sig Dispense Refill  . Norethindrone Acetate-Ethinyl Estrad-FE (LOESTRIN 24 FE) 1-20 MG-MCG(24) tablet Take 1 tablet by mouth daily.        Marland Kitchen  zolpidem (AMBIEN) 10 MG tablet Take 1 tablet (10 mg total) by mouth at bedtime as needed.  30 tablet  5  . DISCONTD: zolpidem (AMBIEN) 10 MG tablet Take 1 tablet (10 mg total) by mouth at bedtime as needed.  30 tablet  0  . isoniazid (NYDRAZID) 300 MG tablet Take 1 tablet (300 mg total) by mouth daily.  30 tablet  5    No Known Allergies   Current Medications, Allergies, Past Medical History, Past Surgical History, Family History, and Social History were reviewed in [employer] record.   Review of Systems    The patient denies fever, chills, sweats, anorexia, fatigue, weakness, malaise, weight loss, sleep disorder, blurring, diplopia, eye irritation, eye discharge, vision loss, eye pain, photophobia, earache, ear discharge, tinnitus,  decreased hearing, nasal congestion, nosebleeds, sore throat, hoarseness, chest pain, palpitations, syncope, dyspnea on exertion, orthopnea, PND, peripheral edema, cough, dyspnea at rest, excessive sputum, hemoptysis, wheezing, pleurisy, nausea, vomiting, diarrhea, constipation, change in bowel habits, abdominal pain, melena, hematochezia, jaundice, gas/bloating, indigestion/heartburn, dysphagia, odynophagia, dysuria, hematuria, urinary frequency, urinary hesitancy, nocturia, incontinence, back pain, joint pain, joint swelling, muscle cramps, muscle weakness, stiffness, arthritis, sciatica, restless legs, leg pain at night, leg pain with exertion, rash, itching, dryness, suspicious lesions, paralysis, paresthesias, seizures, tremors, vertigo, transient blindness, frequent falls, frequent headaches, difficulty walking, depression, anxiety, memory loss, confusion, cold intolerance, heat intolerance, polydipsia, polyphagia, polyuria, unusual weight change, abnormal bruising, bleeding, enlarged lymph nodes, urticaria, allergic rash, hay fever, and recurrent infections.     Objective:   Physical Exam     WD, WN, 35 y/o BF in NAD... GENERAL:  Alert & oriented; pleasant & cooperative... HEENT:  [place]/AT, EOM-wnl, PERRLA, Fundi-benign, EACs-clear, TMs-wnl, NOSE-clear, THROAT-clear & wnl. NECK:  Supple w/ full ROM; no JVD; normal carotid impulses w/o bruits; no thyromegaly or nodules palpated; no lymphadenopathy. CHEST:  Clear to P & A; without wheezes/ rales/ or rhonchi. HEART:  Regular Rhythm; without murmurs/ rubs/ or gallops. ABDOMEN:  Soft & nontender; normal bowel sounds; no organomegaly or masses detected. EXT: without deformities or arthritic changes; no varicose veins/ venous insuffic/ or edema. NEURO:  CN's intact; motor testing normal; sensory testing normal; gait normal & balance OK. DERM:  No lesions noted; no rash etc...  RADIOLOGY DATA:  Reviewed in the EPIC EMR & discussed w/ the patient...     >>CXR 1/13 is clear/ WNL.Marland KitchenMarland Kitchen  LABORATORY DATA:  Reviewed in the EPIC EMR & discussed w/ the patient...    >>LABS>  FLP-wnl on diet alone;  Chems-wnl;  CBC-wnl;  TSH-wnl   Assessment:     CPX>>  Good general health...  Hx pos PPD>  Her CXR remains clear & she remains asymptomatic...  ?Hx impaired fasting gluc>  It is certainly normal now w/o problems identified...  Hx GYN surg>  Followed by Latanya Presser for GYN...  Sleep disorder>  Related to shift work at [employer];  Ok to refill Ambien...  Hx Anemia>  Normal now...     Plan:     Patient's Medications  New Prescriptions   ISONIAZID INH 300mg  tabs take one tab each AM...  Previous Medications   NORETHINDRONE ACETATE-ETHINYL ESTRAD-FE (LOESTRIN 24 FE) 1-20 MG-MCG(24) TABLET    Take 1 tablet by mouth daily.    Modified Medications   Modified Medication Previous Medication   ZOLPIDEM (AMBIEN) 10 MG TABLET zolpidem (AMBIEN) 10 MG tablet      Take 1 tablet (10 mg total) by mouth at bedtime as needed.  Take 1 tablet (10 mg total) by mouth at bedtime as needed.  Discontinued Medications   No medications on file

## 2011-07-28 ENCOUNTER — Ambulatory Visit (INDEPENDENT_AMBULATORY_CARE_PROVIDER_SITE_OTHER): Payer: Managed Care, Other (non HMO) | Admitting: Pulmonary Disease

## 2011-07-28 ENCOUNTER — Other Ambulatory Visit (INDEPENDENT_AMBULATORY_CARE_PROVIDER_SITE_OTHER): Payer: Managed Care, Other (non HMO)

## 2011-07-28 ENCOUNTER — Encounter: Payer: Self-pay | Admitting: Pulmonary Disease

## 2011-07-28 VITALS — BP 122/68 | HR 74 | Temp 97.1°F | Ht 59.0 in | Wt 128.6 lb

## 2011-07-28 DIAGNOSIS — R7611 Nonspecific reaction to tuberculin skin test without active tuberculosis: Secondary | ICD-10-CM

## 2011-07-28 DIAGNOSIS — G4726 Circadian rhythm sleep disorder, shift work type: Secondary | ICD-10-CM

## 2011-07-28 DIAGNOSIS — Z5181 Encounter for therapeutic drug level monitoring: Secondary | ICD-10-CM

## 2011-07-28 LAB — HEPATIC FUNCTION PANEL
Albumin: 4.2 g/dL (ref 3.5–5.2)
Alkaline Phosphatase: 52 U/L (ref 39–117)
Bilirubin, Direct: 0.1 mg/dL (ref 0.0–0.3)
Total Bilirubin: 0.4 mg/dL (ref 0.3–1.2)

## 2011-07-28 MED ORDER — ZOLPIDEM TARTRATE 10 MG PO TABS: 10.0000 mg | ORAL_TABLET | Freq: Every evening | ORAL | Status: DC | PRN

## 2011-07-28 MED ORDER — ISONIAZID 300 MG PO TABS: ORAL_TABLET | ORAL | Status: DC

## 2011-07-28 NOTE — Progress Notes (Signed)
Subjective:     Patient ID: Rose Hansen, female   DOB: 1976-03-02, 36 y.o.   MRN: [id number]  HPI 36 y/o F here for a follow up visit... she enjoys excellent general medical health & has no new complaints or concerns today... 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  ~  June 11, 2011:  Yearly ROV & CPX> Rose Hansen reports a good year & as no new complaints or concerns... She converted her PPD in 2010 by her hx & was seen by employee health at [facility]- no rec for treatment; as she was just [age] at the time & a recent converter I feel she should have been treated w/ INH> we discussed this & decided to start 50yr worth of INH treatment 300mg /d + BVits daily...    See below for f/u CXR & FASTING blood work >> FLP looks great on diet alone...  ~  July 28, 2011:  6wk ROV & Rose Hansen has started on her INH 300mg /d & BVits; doing well w/o side effects- no N/V, no D/C/blood, no abd pain, etc...  We discussed checking LFTs on the INH (they are wnl) & if wnl then we will see her back in 1 yr for her routine CPX & CXR at that time...          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 her GYN is DrNeal & she is up to date on Paps etc... she had surg 2009 for benign cystic teratomas of right ovary (2 separate lesions), uterine fibroids, follicular cyst of left ovary.   NONSPEC REACT TUBERCULIN SKIN TEST W/O ACTIVE TB (ICD-795.51) - they reported pos PPD from [facility] in 2010 at age 36-33 w/ CXRs all neg; they did not embark on a treatment course of INH so we discussed starting this 1/13 for 36yr Rx... ~  CXR 3/08 showed clear, NAD... ~  2010:  She reports that she converted her PPD to pos, never took INH, CXR's have all been neg... ~  CXR 7/11 at [facility] was clear, WNL.Marland Kitchen. ~   CXR 1/12 showed clear lungs, normal heart size, WNL.Marland Kitchen. ~  CXR 1/13 showed clear lungs, WNL (copy mailed to pt for her work); we discussed starting INH 300mg /d prophylaxis... ~  2/13:  Follow up OV & doing satis on the INH, no side effects, tol well, & we checked labs> LFTs= wnl...  Hx of OTHER ABNORMAL GLUCOSE (ICD-790.29) - prev BS in the 100-115 range... ~  labs 9/10 showed BS= 95... on diet alone. ~  labs 1/12 showed FBS= 72 ~  Labs 1/13 showed FBS= 83  Hx GYN surg 3/09 by DrNeal> benign cystic teratomas of right ovary (2 separate lesions), uterine fibroids, follicular cyst of left ovary.   HX, URINARY INFECTION (ICD-V13.02) - no recent UTI symptoms, doing well w/o burning, incontinence, etc...  ? of FIBROMYALGIA (ICD-729.1) - her energy is good, resting satis most nights, wakes refreshed, & denies recent muscle symptoms etc...  CIRCADIAN RHYTHM SLEEP DISORDER SHIFT WORK TYPE (ICD-327.36) ** see above **  ANEMIA, MILD, HX OF (ICD-V12.3) - Hg in the 11 range prev... ~  labs 3/08 showed Hg= 11.7 ~  labs 9/10 showed Hg= 12.4, Fe= 48... rec> OTC FeSO4. ~  labs 1/12 showed Hg= 12.3, MCV= 88 ~  Labs 1/13 showed Hg= 13.0, MCV= 89, Fe= 58 (14%)   Past Surgical History  Procedure Date  . Gyn surgery 08/2007    Dr. Jennette Kettle for benign cystic teratomas of right ovary,uterine fibroids, follicular cyst of left ovary    Outpatient Encounter Prescriptions as of 07/28/2011  Medication Sig Dispense Refill  . isoniazid (NYDRAZID) 300 MG tablet Once a day  90 tablet  3  . Norethindrone Acetate-Ethinyl Estrad-FE (LOESTRIN 24 FE) 1-20 MG-MCG(24) tablet Take 1 tablet by mouth daily.        Marland Kitchen zolpidem (AMBIEN) 10 MG tablet Take 1 tablet (10 mg total) by mouth at bedtime as needed.  90 tablet  3    No Known Allergies   Current Medications, Allergies, Past Medical History, Past Surgical History, Family History, and Social History were reviewed in [employer] record.   Review of  Systems    The patient denies fever, chills, sweats, anorexia, fatigue, weakness, malaise, weight loss, sleep disorder, blurring, diplopia, eye irritation, eye discharge, vision loss, eye pain, photophobia, earache, ear discharge, tinnitus, decreased hearing, nasal congestion, nosebleeds, sore throat, hoarseness, chest pain, palpitations, syncope, dyspnea on exertion, orthopnea, PND, peripheral edema, cough, dyspnea at rest, excessive sputum, hemoptysis, wheezing, pleurisy, nausea, vomiting, diarrhea, constipation, change in bowel habits, abdominal pain, melena, hematochezia, jaundice, gas/bloating, indigestion/heartburn, dysphagia, odynophagia, dysuria, hematuria, urinary frequency, urinary hesitancy, nocturia, incontinence, back pain, joint pain, joint swelling, muscle cramps, muscle weakness, stiffness, arthritis, sciatica, restless legs, leg pain at night, leg pain with exertion, rash, itching, dryness, suspicious lesions, paralysis, paresthesias, seizures, tremors, vertigo, transient blindness, frequent falls, frequent headaches, difficulty walking, depression, anxiety, memory loss, confusion, cold intolerance, heat intolerance, polydipsia, polyphagia, polyuria, unusual weight change, abnormal bruising, bleeding, enlarged lymph nodes, urticaria, allergic rash, hay fever, and recurrent infections.     Objective:   Physical Exam     WD, WN, 36 y/o BF in NAD... GENERAL:  Alert & oriented; pleasant & cooperative... HEENT:  [place]/AT, EOM-wnl, PERRLA, Fundi-benign, EACs-clear, TMs-wnl, NOSE-clear, THROAT-clear & wnl. NECK:  Supple w/ full ROM; no JVD; normal carotid impulses w/o bruits; no thyromegaly or nodules palpated; no lymphadenopathy. CHEST:  Clear to P & A; without wheezes/ rales/ or rhonchi. HEART:  Regular Rhythm; without murmurs/ rubs/ or gallops. ABDOMEN:  Soft & nontender; normal bowel sounds; no organomegaly or masses detected. EXT: without deformities or arthritic changes; no varicose  veins/ venous insuffic/ or edema. NEURO:  CN's intact; motor testing normal; sensory testing normal; gait normal & balance OK. DERM:  No lesions noted; no rash etc...  RADIOLOGY DATA:  Reviewed in the EPIC EMR & discussed w/ the patient...    >>CXR 1/13 is clear/ WNL.Marland KitchenMarland Kitchen  LABORATORY DATA:  Reviewed in the EPIC EMR & discussed w/ the patient...    >>LABS>  FLP-wnl on diet alone;  Chems-wnl;  CBC-wnl;  TSH-wnl   Assessment:     CPX>>  Good general health...  Hx pos PPD>  Her CXR remains clear & she remains asymptomatic; started on INH 300mg /d 1/13, tol well & f/u LFTs are wnl.  ?Hx impaired fasting gluc>  It is certainly normal now w/o problems identified...  Hx GYN surg>  Followed by Latanya Presser for GYN...  Sleep disorder>  Related to shift work at [employer];  Ok to refill Ambien...  Hx Anemia>  Normal now...     Plan:  MEDs WRITTEN FOR 73mo SUPPLIES... Patient's Medications  New Prescriptions   No medications on file  Previous Medications   NORETHINDRONE ACETATE-ETHINYL ESTRAD-FE (LOESTRIN 24 FE) 1-20 MG-MCG(24) TABLET    Take 1 tablet by mouth daily.    Modified Medications   Modified Medication Previous Medication   ISONIAZID (NYDRAZID) 300 MG TABLET isoniazid (NYDRAZID) 300 MG tablet      Once a day    Once a day   ZOLPIDEM (AMBIEN) 10 MG TABLET zolpidem (AMBIEN) 10 MG tablet      Take 1 tablet (10 mg total) by mouth at bedtime as needed.    Take 1 tablet (10 mg total) by mouth at bedtime as needed.  Discontinued Medications   No medications on file

## 2011-07-28 NOTE — Patient Instructions (Signed)
Today we updated your med list in our EPIC system...    Continue your current medications the same...    We refilled your meds for 90d supplies...  Today we did your follow up blood work to monitor for any side effect from the INH...    Please call the PHONE TREE in a few days for your results...    Dial [id number] & when prompted enter your patient number followed by the # symbol...    Your patient number is:  [phone]  Call for any questions...  If there are no problems or concerns then we will see you in [id number] for your next physical..Marland Kitchen

## 2011-12-16 ENCOUNTER — Other Ambulatory Visit: Payer: Self-pay | Admitting: Obstetrics & Gynecology

## 2011-12-16 DIAGNOSIS — R928 Other abnormal and inconclusive findings on diagnostic imaging of breast: Secondary | ICD-10-CM

## 2011-12-19 ENCOUNTER — Ambulatory Visit
Admission: RE | Admit: 2011-12-19 | Discharge: 2011-12-19 | Disposition: A | Discharge status: Home / Self Care | Payer: Managed Care, Other (non HMO) | Source: Ambulatory Visit | Attending: Obstetrics & Gynecology | Admitting: Obstetrics & Gynecology

## 2011-12-19 DIAGNOSIS — R928 Other abnormal and inconclusive findings on diagnostic imaging of breast: Secondary | ICD-10-CM

## 2012-06-29 ENCOUNTER — Ambulatory Visit (INDEPENDENT_AMBULATORY_CARE_PROVIDER_SITE_OTHER): Payer: Managed Care, Other (non HMO) | Admitting: Pulmonary Disease

## 2012-06-29 ENCOUNTER — Encounter: Payer: Self-pay | Admitting: Pulmonary Disease

## 2012-06-29 ENCOUNTER — Other Ambulatory Visit (INDEPENDENT_AMBULATORY_CARE_PROVIDER_SITE_OTHER): Payer: Managed Care, Other (non HMO)

## 2012-06-29 ENCOUNTER — Ambulatory Visit (INDEPENDENT_AMBULATORY_CARE_PROVIDER_SITE_OTHER)
Admission: RE | Admit: 2012-06-29 | Discharge: 2012-06-29 | Disposition: A | Discharge status: Home / Self Care | Payer: Managed Care, Other (non HMO) | Source: Ambulatory Visit | Attending: Pulmonary Disease | Admitting: Pulmonary Disease

## 2012-06-29 VITALS — BP 120/70 | HR 93 | Temp 98.2°F | Ht 59.0 in | Wt 143.6 lb

## 2012-06-29 DIAGNOSIS — R7611 Nonspecific reaction to tuberculin skin test without active tuberculosis: Secondary | ICD-10-CM

## 2012-06-29 DIAGNOSIS — Z0000 Encounter for general adult medical examination without abnormal findings: Secondary | ICD-10-CM

## 2012-06-29 DIAGNOSIS — G4726 Circadian rhythm sleep disorder, shift work type: Secondary | ICD-10-CM

## 2012-06-29 LAB — LIPID PANEL
Cholesterol: 126 mg/dL (ref 0–200)
LDL Cholesterol: 67 mg/dL (ref 0–99)
Triglycerides: 34 mg/dL (ref 0.0–149.0)
VLDL: 6.8 mg/dL (ref 0.0–40.0)

## 2012-06-29 LAB — BASIC METABOLIC PANEL
BUN: 11 mg/dL (ref 6–23)
Chloride: 105 mEq/L (ref 96–112)
Creatinine, Ser: 0.7 mg/dL (ref 0.4–1.2)
Glucose, Bld: 85 mg/dL (ref 70–99)
Potassium: 3.6 mEq/L (ref 3.5–5.1)

## 2012-06-29 LAB — URINALYSIS, ROUTINE W REFLEX MICROSCOPIC
Bilirubin Urine: NEGATIVE
Leukocytes, UA: NEGATIVE
Nitrite: NEGATIVE
Specific Gravity, Urine: 1.025 (ref 1.000–1.030)
Total Protein, Urine: NEGATIVE
pH: 6 (ref 5.0–8.0)

## 2012-06-29 LAB — HEPATIC FUNCTION PANEL
ALT: 53 U/L — ABNORMAL HIGH (ref 0–35)
AST: 44 U/L — ABNORMAL HIGH (ref 0–37)
Albumin: 4 g/dL (ref 3.5–5.2)

## 2012-06-29 LAB — CBC WITH DIFFERENTIAL/PLATELET
Basophils Relative: 0.9 % (ref 0.0–3.0)
Eosinophils Relative: 3.9 % (ref 0.0–5.0)
Lymphocytes Relative: 40.3 % (ref 12.0–46.0)
Monocytes Relative: 5.7 % (ref 3.0–12.0)
Neutrophils Relative %: 49.2 % (ref 43.0–77.0)
Platelets: 302 10*3/uL (ref 150.0–400.0)
RBC: 4.37 Mil/uL (ref 3.87–5.11)
WBC: 6.9 10*3/uL (ref 4.5–10.5)

## 2012-06-29 LAB — TSH: TSH: 0.85 u[IU]/mL (ref 0.35–5.50)

## 2012-06-29 MED ORDER — ZOLPIDEM TARTRATE 10 MG PO TABS: 10.0000 mg | ORAL_TABLET | Freq: Every evening | ORAL | Status: DC | PRN

## 2012-06-29 NOTE — Patient Instructions (Addendum)
Today we updated your med list in our EPIC system...    Continue your current medications the same...  Congrats on completing the 1 year's worth of INH therapy for your pos PPD skin test...  Today we did your follow up CXR, EKG, & FASTING blood work...    We will contact you w/ the results when avail...  Call for any problems or if we can be of service in any way...  Let's continue our yearly check ups.Marland Kitchen

## 2012-06-29 NOTE — Progress Notes (Signed)
Subjective:     Patient ID: Rose Hansen, female   DOB: 1975-11-06, 38 y.o.   MRN: [id number]  HPI 37 y/o F here for a follow up visit... she enjoys excellent general medical health & has no new complaints or concerns today... 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  ~  June 11, 2011:  Yearly ROV & CPX> Rose Hansen reports a good year & as no new complaints or concerns... She converted her PPD in 2010 by her hx & was seen by employee health at [facility]- no rec for treatment; as she was just [age] at the time & a recent converter I feel she should have been treated w/ INH> we discussed this & decided to start 71yr worth of INH treatment 300mg /d + BVits daily...    See below for f/u CXR & FASTING blood work >> FLP looks great on diet alone...  ~  July 28, 2011:  6wk ROV & Rose Hansen has started on her INH 300mg /d & BVits; doing well w/o side effects- no N/V, no D/C/blood, no abd pain, etc...  We discussed checking LFTs on the INH (they are wnl) & if wnl then we will see her back in 1 yr for her routine CPX & CXR at that time...  ~  June 29, 2012:  Yearly ROV & CPX> Rose Hansen has finished her 38yr of INH rx for her pos PPD, tolerated well, no issues identified; CXR today is clear, NAD; She exercises regularly at the gym 3x/wk and has no new complaints or concerns...     Hx pos PPD, & treated w/ 60yr INH therapy in 2013>  She is asymptomatic w/o chest symptoms, cough, sput, etc; f/u CXR is clear, NAD...    Hx impaired fasting glucose>  This has not been an issue for several yrsa & FBS 1/14 = 85...    Weight gain>  I note that she has gained from 129# (BMI=26) last yr to 144# this yr; she is 44" tall; we reviewed diet, exercise & wt reduction strategies...    GYN-  Followed by Rose Hansen, she reports check up w/ PAP etc 6/13 & everything was OK...    Hx UTI>  No recent symtoms, voiding satis w/o  issues reported...    Shift workers sleep disorder>  She has Ambien10mg  for prn use...    Hx anemia>  This has resolved on oral iron therapy & attn to menses etc; Hg= 12.9  We reviewed prob list, meds, xrays and labs> see below for updates >>  CXR 1/14 shows normal heart size, clear lungs, wnl, NAD.Marland KitchenMarland Kitchen EKG 1/14 shows NSR, rate69, wnl, NAD... LABS 1/14:  FLP- at goals on diet alone;  Chems- wnl x borderline GOT/GPT;  CBC- wnl;  TSH=0.85;  UA- wnl           Problem List:   PHYSICAL EXAMINATION (ICD-V70.0) - she is a Dialysis nurse at [employer]- she has had the HepB vaccine, neg PPD's, Tetanus shot 1994 & 9/10... her GYN is DrNeal & she is up to date on Paps etc... she had surg 2009 for benign cystic teratomas of right ovary (2 separate lesions), uterine fibroids, follicular cyst of left ovary.   NONSPEC REACT TUBERCULIN SKIN TEST W/O ACTIVE TB (ICD-795.51) - they reported pos PPD from [facility] in 2010 at age 41-33 w/ CXRs all neg; they did not  embark on a treatment course of INH so we discussed starting this 1/13 for 11yr Rx... ~  CXR 3/08 showed clear, NAD... ~  2010:  She reports that she converted her PPD to pos, never took INH, CXR's have all been neg... ~  CXR 7/11 at [facility] was clear, WNL.Marland Kitchen. ~  CXR 1/12 showed clear lungs, normal heart size, WNL.Marland Kitchen. ~  CXR 1/13 showed clear lungs, WNL (copy mailed to pt for her work); we discussed starting INH 300mg /d prophylaxis... ~  2/13:  Follow up OV & doing satis on the INH, no side effects, tol well, & we checked labs> LFTs= wnl... ~  1/14:  She has finished the INH, tol well; f/u CXR is clear, wnl...  Hx of OTHER ABNORMAL GLUCOSE (ICD-790.29) - prev BS in the 100-115 range... ~  labs 9/10 showed BS= 95... on diet alone. ~  labs 1/12 showed FBS= 72 ~  Labs 1/13 showed FBS= 83 ~  Labs 1/14 showed FBS= 85  Hx GYN surg 3/09 by DrNeal> benign cystic teratomas of right ovary (2 separate lesions), uterine fibroids, follicular cyst of left  ovary.   HX, URINARY INFECTION (ICD-V13.02) - no recent UTI symptoms, doing well w/o burning, incontinence, etc...  ? of FIBROMYALGIA (ICD-729.1) - her energy is good, resting satis most nights, wakes refreshed, & denies recent muscle symptoms etc...  CIRCADIAN RHYTHM SLEEP DISORDER SHIFT WORK TYPE (ICD-327.36) ** see above ** ~  1/14:  She remains on 3rd shift at [employer]; the Ambien is used prn & is helpful she notes...   ANEMIA, MILD, HX OF (ICD-V12.3) - Hg in the 11 range prev... ~  labs 3/08 showed Hg= 11.7 ~  labs 9/10 showed Hg= 12.4, Fe= 48... rec> OTC FeSO4. ~  labs 1/12 showed Hg= 12.3, MCV= 88 ~  Labs 1/13 showed Hg= 13.0, MCV= 89, Fe= 58 (14%) ~  Labs 1/14 showed Hg= 12.9   Past Surgical History  Procedure Date  . Gyn surgery 08/2007    Dr. Jennette Kettle for benign cystic teratomas of right ovary,uterine fibroids, follicular cyst of left ovary    Outpatient Encounter Prescriptions as of 06/29/2012  Medication Sig Dispense Refill  . isoniazid (NYDRAZID) 300 MG tablet Once a day  90 tablet  3  . Norethindrone Acetate-Ethinyl Estrad-FE (LOESTRIN 24 FE) 1-20 MG-MCG(24) tablet Take 1 tablet by mouth daily.        Marland Kitchen zolpidem (AMBIEN) 10 MG tablet Take 1 tablet (10 mg total) by mouth at bedtime as needed.  90 tablet  3  . [DISCONTINUED] zolpidem (AMBIEN) 10 MG tablet Take 1 tablet (10 mg total) by mouth at bedtime as needed for sleep.  30 tablet  5    No Known Allergies   Current Medications, Allergies, Past Medical History, Past Surgical History, Family History, and Social History were reviewed in [employer] record.   Review of Systems    The patient denies fever, chills, sweats, anorexia, fatigue, weakness, malaise, weight loss, sleep disorder, blurring, diplopia, eye irritation, eye discharge, vision loss, eye pain, photophobia, earache, ear discharge, tinnitus, decreased hearing, nasal congestion, nosebleeds, sore throat, hoarseness, chest pain,  palpitations, syncope, dyspnea on exertion, orthopnea, PND, peripheral edema, cough, dyspnea at rest, excessive sputum, hemoptysis, wheezing, pleurisy, nausea, vomiting, diarrhea, constipation, change in bowel habits, abdominal pain, melena, hematochezia, jaundice, gas/bloating, indigestion/heartburn, dysphagia, odynophagia, dysuria, hematuria, urinary frequency, urinary hesitancy, nocturia, incontinence, back pain, joint pain, joint swelling, muscle cramps, muscle weakness, stiffness, arthritis, sciatica, restless legs,  leg pain at night, leg pain with exertion, rash, itching, dryness, suspicious lesions, paralysis, paresthesias, seizures, tremors, vertigo, transient blindness, frequent falls, frequent headaches, difficulty walking, depression, anxiety, memory loss, confusion, cold intolerance, heat intolerance, polydipsia, polyphagia, polyuria, unusual weight change, abnormal bruising, bleeding, enlarged lymph nodes, urticaria, allergic rash, hay fever, and recurrent infections.     Objective:   Physical Exam     WD, WN, 36 y/o BF in NAD... GENERAL:  Alert & oriented; pleasant & cooperative... HEENT:  /AT, EOM-wnl, PERRLA, Fundi-benign, EACs-clear, TMs-wnl, NOSE-clear, THROAT-clear & wnl. NECK:  Supple w/ full ROM; no JVD; normal carotid impulses w/o bruits; no thyromegaly or nodules palpated; no lymphadenopathy. CHEST:  Clear to P & A; without wheezes/ rales/ or rhonchi. HEART:  Regular Rhythm; without murmurs/ rubs/ or gallops. ABDOMEN:  Soft & nontender; normal bowel sounds; no organomegaly or masses detected. EXT: without deformities or arthritic changes; no varicose veins/ venous insuffic/ or edema. NEURO:  CN's intact; motor testing normal; sensory testing normal; gait normal & balance OK. DERM:  No lesions noted; no rash etc...  RADIOLOGY DATA:  Reviewed in the EPIC EMR & discussed w/ the patient...  LABORATORY DATA:  Reviewed in the EPIC EMR & discussed w/ the  patient...   Assessment:      CPX>>  Good general health...  Hx pos PPD>  Her CXR remains clear & she remains asymptomatic; she has completed the years worth of INH...  ?Hx impaired fasting gluc>  It is certainly normal now w/o problems identified...  Hx GYN surg>  Followed by Rose Hansen for GYN...  Sleep disorder>  Related to shift work at [employer];  Ok to refill Ambien...  Hx Anemia>  Normal now...     Plan:                                                MEDs WRITTEN FOR 45mo SUPPLIES... Patient's Medications  New Prescriptions   No medications on file  Previous Medications   FERROUS SULFATE 325 (65 FE) MG TABLET    Take 325 mg by mouth daily with breakfast.   NORETHINDRONE ACETATE-ETHINYL ESTRAD-FE (LOESTRIN 24 FE) 1-20 MG-MCG(24) TABLET    Take 1 tablet by mouth daily.    Modified Medications   Modified Medication Previous Medication   ZOLPIDEM (AMBIEN) 10 MG TABLET zolpidem (AMBIEN) 10 MG tablet      Take 1 tablet (10 mg total) by mouth at bedtime as needed.    Take 1 tablet (10 mg total) by mouth at bedtime as needed.  Discontinued Medications   ISONIAZID (NYDRAZID) 300 MG TABLET    Once a day   ZOLPIDEM (AMBIEN) 10 MG TABLET    Take 1 tablet (10 mg total) by mouth at bedtime as needed for sleep.

## 2012-10-13 ENCOUNTER — Telehealth: Payer: Self-pay | Admitting: Pulmonary Disease

## 2012-10-13 NOTE — Telephone Encounter (Signed)
Pt was aware of results just wanted a copy as she states she never got it in the mail from January. Pt will come by the office to pick up copy-at front desk.

## 2013-01-17 ENCOUNTER — Telehealth: Payer: Self-pay | Admitting: Pulmonary Disease

## 2013-01-17 MED ORDER — ZOLPIDEM TARTRATE 10 MG PO TABS: 10.0000 mg | ORAL_TABLET | Freq: Every evening | ORAL | Status: DC | PRN

## 2013-01-17 NOTE — Telephone Encounter (Signed)
Last OV 06/29/12 No pending OV Last fill 06/29/12 #90 with 3 additional refills  SN - please advise. Thanks.

## 2013-01-17 NOTE — Telephone Encounter (Signed)
rx has been printed and placed on SN cart to be signed and will be faxed to [facility].  Nothing further is needed.

## 2013-01-19 ENCOUNTER — Telehealth: Payer: Self-pay | Admitting: Pulmonary Disease

## 2013-01-19 NOTE — Telephone Encounter (Signed)
Zolpidem rx was sent to [facility] on 01/17/13 # 90 x 1.  Called, spoke with pt.  Reports [facility] can no longer supply zolpidem to her as a 90 day rx.  Per pt, they are requesting she get this from a local pharm now for a 30 day rx.  [facility] [address].  I called [employer] to cancel rx but was on hold > 5 min.  WCB.  After cancelling rx, will need to call new rx into [facility] [address].

## 2013-01-20 MED ORDER — ZOLPIDEM TARTRATE 10 MG PO TABS: 10.0000 mg | ORAL_TABLET | Freq: Every evening | ORAL | Status: DC | PRN

## 2013-01-20 NOTE — Telephone Encounter (Signed)
Please call & notify pt once [facility]pidem [facility] has been [facility] [address].  Pt can be reached at [phone].  Rose Hansen

## 2013-01-20 NOTE — Telephone Encounter (Signed)
I called [place] [phone]. I called and had this cancelled I have [facility] day supply. Pt aware. Nothing further needed

## 2013-02-15 ENCOUNTER — Telehealth: Payer: Self-pay | Admitting: Pulmonary Disease

## 2013-02-15 NOTE — Telephone Encounter (Signed)
Pt returned call & would like to pick up this afternoon, please.  Rose Hansen

## 2013-02-15 NOTE — Telephone Encounter (Signed)
LM for pt that copy of cxr report was left at front desk for her to pick up.

## 2013-02-15 NOTE — Telephone Encounter (Signed)
LMTCB x 1.   Does she need this mailed or will she pick up?

## 2013-04-25 ENCOUNTER — Other Ambulatory Visit: Payer: Self-pay | Admitting: *Deleted

## 2013-04-25 ENCOUNTER — Telehealth: Payer: Self-pay | Admitting: Pulmonary Disease

## 2013-04-25 MED ORDER — FIRST-DUKES MOUTHWASH MT SUSP: 5.0000 mL | Freq: Four times a day (QID) | OROMUCOSAL | Status: DC | PRN

## 2013-04-25 MED ORDER — AZITHROMYCIN 250 MG PO TABS: ORAL_TABLET | ORAL | Status: DC

## 2013-04-25 NOTE — Telephone Encounter (Signed)
rx sent and pt is aware. Meiko Ives, CMA  

## 2013-04-25 NOTE — Telephone Encounter (Signed)
Per SN-- Ok for zpak #1  Take as directed Delsym otc as needed Is she wants MMW---ok to send in  #120 ml   1 tsp gargle and swallow four times daily as needed.

## 2013-04-25 NOTE — Telephone Encounter (Signed)
[facility] and was advised this was originally sent to [address] and reason it was being ejected on their end. She will [facility] [address] and cancel this and get this ready for pt. Nothing further needed

## 2013-04-25 NOTE — Telephone Encounter (Signed)
I called and spoke with pt. She c/o sore throat, dry cough, blowing out yellow phlem x 2 days. No PND, no fever, no chills, no sweats. She has been taking OTC tylenol cough and cold and nyquil. Please advise Dr. Kriste Basque thanks  No Known Allergies

## 2013-06-29 ENCOUNTER — Encounter (INDEPENDENT_AMBULATORY_CARE_PROVIDER_SITE_OTHER): Payer: Self-pay

## 2013-06-29 ENCOUNTER — Ambulatory Visit (INDEPENDENT_AMBULATORY_CARE_PROVIDER_SITE_OTHER): Payer: BC Managed Care – PPO | Admitting: Pulmonary Disease

## 2013-06-29 ENCOUNTER — Other Ambulatory Visit (INDEPENDENT_AMBULATORY_CARE_PROVIDER_SITE_OTHER): Payer: BC Managed Care – PPO

## 2013-06-29 ENCOUNTER — Ambulatory Visit (INDEPENDENT_AMBULATORY_CARE_PROVIDER_SITE_OTHER)
Admission: RE | Admit: 2013-06-29 | Discharge: 2013-06-29 | Disposition: A | Discharge status: Home / Self Care | Payer: BC Managed Care – PPO | Source: Ambulatory Visit | Attending: Pulmonary Disease | Admitting: Pulmonary Disease

## 2013-06-29 ENCOUNTER — Encounter: Payer: Self-pay | Admitting: Pulmonary Disease

## 2013-06-29 VITALS — BP 116/76 | HR 84 | Temp 97.6°F | Ht 60.0 in | Wt 147.0 lb

## 2013-06-29 DIAGNOSIS — R7611 Nonspecific reaction to tuberculin skin test without active tuberculosis: Secondary | ICD-10-CM

## 2013-06-29 DIAGNOSIS — G4726 Circadian rhythm sleep disorder, shift work type: Secondary | ICD-10-CM

## 2013-06-29 DIAGNOSIS — Z0000 Encounter for general adult medical examination without abnormal findings: Secondary | ICD-10-CM | POA: Insufficient documentation

## 2013-06-29 LAB — CBC WITH DIFFERENTIAL/PLATELET
BASOS PCT: 0.7 % (ref 0.0–3.0)
Basophils Absolute: 0 10*3/uL (ref 0.0–0.1)
EOS PCT: 3.4 % (ref 0.0–5.0)
Eosinophils Absolute: 0.2 10*3/uL (ref 0.0–0.7)
HCT: 39 % (ref 36.0–46.0)
Hemoglobin: 12.5 g/dL (ref 12.0–15.0)
LYMPHS PCT: 46.8 % — AB (ref 12.0–46.0)
Lymphs Abs: 3.1 10*3/uL (ref 0.7–4.0)
MCHC: 32 g/dL (ref 30.0–36.0)
MCV: 88.5 fl (ref 78.0–100.0)
MONO ABS: 0.4 10*3/uL (ref 0.1–1.0)
MONOS PCT: 6.6 % (ref 3.0–12.0)
NEUTROS PCT: 42.5 % — AB (ref 43.0–77.0)
Neutro Abs: 2.8 10*3/uL (ref 1.4–7.7)
PLATELETS: 337 10*3/uL (ref 150.0–400.0)
RBC: 4.4 Mil/uL (ref 3.87–5.11)
RDW: 13.9 % (ref 11.5–14.6)
WBC: 6.6 10*3/uL (ref 4.5–10.5)

## 2013-06-29 LAB — HEPATIC FUNCTION PANEL
ALBUMIN: 4.4 g/dL (ref 3.5–5.2)
ALT: 14 U/L (ref 0–35)
AST: 17 U/L (ref 0–37)
Alkaline Phosphatase: 76 U/L (ref 39–117)
BILIRUBIN TOTAL: 0.7 mg/dL (ref 0.3–1.2)
Bilirubin, Direct: 0 mg/dL (ref 0.0–0.3)
Total Protein: 7.8 g/dL (ref 6.0–8.3)

## 2013-06-29 LAB — LIPID PANEL
CHOLESTEROL: 147 mg/dL (ref 0–200)
HDL: 56.5 mg/dL (ref >39.00)
LDL CALC: 80 mg/dL (ref 0–99)
TRIGLYCERIDES: 52 mg/dL (ref 0.0–149.0)
Total CHOL/HDL Ratio: 3
VLDL: 10.4 mg/dL (ref 0.0–40.0)

## 2013-06-29 LAB — BASIC METABOLIC PANEL
BUN: 8 mg/dL (ref 6–23)
CHLORIDE: 106 meq/L (ref 96–112)
CO2: 27 mEq/L (ref 19–32)
CREATININE: 0.7 mg/dL (ref 0.4–1.2)
Calcium: 9.6 mg/dL (ref 8.4–10.5)
GFR: 113.42 mL/min (ref >60.00)
GLUCOSE: 91 mg/dL (ref 70–99)
Potassium: 4 mEq/L (ref 3.5–5.1)
Sodium: 139 mEq/L (ref 135–145)

## 2013-06-29 LAB — TSH: TSH: 0.54 u[IU]/mL (ref 0.35–5.50)

## 2013-06-29 MED ORDER — ZOLPIDEM TARTRATE 10 MG PO TABS: 10.0000 mg | ORAL_TABLET | Freq: Every evening | ORAL | Status: DC | PRN

## 2013-06-29 NOTE — Patient Instructions (Signed)
Today we updated your med list in our EPIC system...    Continue your current medications the same...  We refilled your Ambien per request...  Call for any questions...  Let's plan a follow up visit in [id number], sooner if needed for problems.Marland Kitchen..Marland Kitchen

## 2013-06-29 NOTE — Progress Notes (Signed)
Subjective:     Patient ID: Rose Hansen, female   DOB: 20-Mar-1976, 38 y.o.   MRN: [id number]  HPI 38 y/o F here for a follow up visit... she enjoys excellent general medical health & has no new complaints or concerns today... 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  ~  June 11, 2011:  Yearly ROV & CPX> [name] reports a good year & as no new complaints or concerns... She converted her PPD in 2010 by her hx & was seen by employee health at [facility]- no rec for treatment; as she was just [age] at the time & a recent converter I feel she should have been treated w/ INH> we discussed this & decided to start 7yr worth of INH treatment 300mg /d + BVits daily...    See below for f/u CXR & FASTING blood work >> FLP looks great on diet alone...  ~  July 28, 2011:  6wk ROV & Lutricia has started on her INH 300mg /d & BVits; doing well w/o side effects- no N/V, no D/C/blood, no abd pain, etc...  We discussed checking LFTs on the INH (they are wnl) & if wnl then we will see her back in 1 yr for her routine CPX & CXR at that time...  ~  June 29, 2012:  Yearly ROV & CPX> [name] has finished her 31yr of INH rx for her pos PPD, tolerated well, no issues identified; CXR today is clear, NAD; She exercises regularly at the gym 3x/wk and has no new complaints or concerns...    Hx pos PPD, & treated w/ 57yr INH therapy in 2013>  She is asymptomatic w/o chest symptoms, cough, sput, etc; f/u CXR is clear, NAD...    Hx impaired fasting glucose>  This has not been an issue for several yrsa & FBS 1/14 = 85...    Weight gain>  I note that she has gained from 129# (BMI=26) last yr to 144# this yr; she is 44" tall; we reviewed diet, exercise & wt reduction strategies...    GYN-  Followed by Latanya Presser, she reports check up w/ PAP etc 6/13 & everything was OK...    Hx UTI>  No recent symtoms, voiding satis w/o issues  reported...    Shift workers sleep disorder>  She has Ambien10mg  for prn use...    Hx anemia>  This has resolved on oral iron therapy & attn to menses etc; Hg= 12.9 We reviewed prob list, meds, xrays and labs> see below for updates >>   CXR 1/14 shows normal heart size, clear lungs, wnl, NAD.Marland KitchenMarland Kitchen  EKG 1/14 shows NSR, rate69, wnl, NAD...  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 Feeling well, no new complaints or concerns... We reviewed the following medical problems during today's office visit >>     Hx pos PPD, & treated w/ 74yr INH therapy in 2013>  She is asymptomatic w/o chest symptoms, cough, sput, etc; f/u CXR is clear, NAD...    Hx impaired fasting glucose>  This has not been an issue for several yrs & FBS 1/15 = 96...    Weight gain>  I note that  she has gained from 129# (BMI=26) 17yrs ago to 147# this yr; she is 65" tall; we reviewed diet, exercise & wt reduction strategies...    GYN-  Followed by Latanya Presser, she reports check up w/ PAP etc 6/13 & everything was OK...    Hx UTI>  No recent symtoms, voiding satis w/o issues reported...    Shift workers sleep disorder>  She has Ambien10mg  for prn use...    Hx anemia>  This has resolved on oral iron therapy & attn to menses etc; Hg= 12.5 We reviewed prob list, meds, xrays and labs> see below for updates >>   CXR 1/15 showed norm heart size, clear lungs, NAD...  LABS 1/15:  FLP- at goals on diet alone;  Chems- wnl;  CBC- wnl w/ Hg=12.5;  TSH=0.54...           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 her GYN is DrNeal & she is up to date on Paps etc... she had surg 2009 for benign cystic teratomas of right ovary (2 separate lesions),  uterine fibroids, follicular cyst of left ovary.   NONSPEC REACT TUBERCULIN SKIN TEST W/O ACTIVE TB (ICD-795.51) - they reported pos PPD from [facility] in 2010 at age 45-33 w/ CXRs all neg; they did not embark on a treatment course of INH so we discussed starting this 1/13 for 52yr Rx... ~  CXR 3/08 showed clear, NAD... ~  2010:  She reports that she converted her PPD to pos, never took INH, CXR's have all been neg... ~  CXR 7/11 at [facility] was clear, WNL.Marland Kitchen. ~  CXR 1/12 showed clear lungs, normal heart size, WNL.Marland Kitchen. ~  CXR 1/13 showed clear lungs, WNL (copy mailed to pt for her work); we discussed starting INH 300mg /d prophylaxis... ~  2/13:  Follow up OV & doing satis on the INH, no side effects, tol well, & we checked labs> LFTs= wnl... ~  1/14:  She has finished the INH, tol well; f/u CXR is clear, wnl...  Hx of OTHER ABNORMAL GLUCOSE (ICD-790.29) - prev BS in the 100-115 range... ~  labs 9/10 showed BS= 95... on diet alone. ~  labs 1/12 showed FBS= 72 ~  Labs 1/13 showed FBS= 83 ~  Labs 1/14 showed FBS= 85  Hx GYN surg 3/09 by DrNeal> benign cystic teratomas of right ovary (2 separate lesions), uterine fibroids, follicular cyst of left ovary.   HX, URINARY INFECTION (ICD-V13.02) - no recent UTI symptoms, doing well w/o burning, incontinence, etc...  ? of FIBROMYALGIA (ICD-729.1) - her energy is good, resting satis most nights, wakes refreshed, & denies recent muscle symptoms etc...  CIRCADIAN RHYTHM SLEEP DISORDER SHIFT WORK TYPE (ICD-327.36) ** see above ** ~  1/14:  She remains on 3rd shift at [facility] [employer] [facility] [employer]; the Ambien is used prn & is helpful she notes...   ANEMIA, MILD, HX OF (ICD-V12.3) - Hg in the 11 range prev... ~  labs 3/08 showed Hg= 11.7 ~  labs 9/10 showed Hg= 12.4, Fe= 48... rec> OTC FeSO4. ~  labs 1/12 showed Hg= 12.3, MCV= 88 ~  Labs 1/13 showed Hg= 13.0, MCV= 89, Fe= 58 (14%) ~  Labs 1/14 showed Hg= 12.9   Past Surgical History  Procedure Laterality Date  . Gyn  surgery  08/2007    Dr. Jennette Kettle for benign cystic teratomas of right ovary,uterine fibroids,  follicular cyst of left ovary    Outpatient Encounter Prescriptions as of 06/29/2013  Medication Sig  . Diphenhyd-Hydrocort-Nystatin (FIRST-DUKES MOUTHWASH) SUSP Take 5 mLs by mouth 4 (four) times daily as needed. Swish and gargle  . Norethindrone Acetate-Ethinyl Estrad-FE (LOESTRIN 24 FE) 1-20 MG-MCG(24) tablet Take 1 tablet by mouth daily.    Marland Kitchen zolpidem (AMBIEN) 10 MG tablet Take 1 tablet (10 mg total) by mouth at bedtime as needed.  . [DISCONTINUED] azithromycin (ZITHROMAX) 250 MG tablet As directed  . [DISCONTINUED] ferrous sulfate 325 (65 FE) MG tablet Take 325 mg by mouth daily with breakfast.    No Known Allergies   Current Medications, Allergies, Past Medical History, Past Surgical History, Family History, and Social History were reviewed in [employer] record.   Review of Systems    The patient denies fever, chills, sweats, anorexia, fatigue, weakness, malaise, weight loss, sleep disorder, blurring, diplopia, eye irritation, eye discharge, vision loss, eye pain, photophobia, earache, ear discharge, tinnitus, decreased hearing, nasal congestion, nosebleeds, sore throat, hoarseness, chest pain, palpitations, syncope, dyspnea on exertion, orthopnea, PND, peripheral edema, cough, dyspnea at rest, excessive sputum, hemoptysis, wheezing, pleurisy, nausea, vomiting, diarrhea, constipation, change in bowel habits, abdominal pain, melena, hematochezia, jaundice, gas/bloating, indigestion/heartburn, dysphagia, odynophagia, dysuria, hematuria, urinary frequency, urinary hesitancy, nocturia, incontinence, back pain, joint pain, joint swelling, muscle cramps, muscle weakness, stiffness, arthritis, sciatica, restless legs, leg pain at night, leg pain with exertion, rash, itching, dryness, suspicious lesions, paralysis, paresthesias, seizures, tremors, vertigo, transient blindness, frequent  falls, frequent headaches, difficulty walking, depression, anxiety, memory loss, confusion, cold intolerance, heat intolerance, polydipsia, polyphagia, polyuria, unusual weight change, abnormal bruising, bleeding, enlarged lymph nodes, urticaria, allergic rash, hay fever, and recurrent infections.     Objective:   Physical Exam     WD, WN, 38 y/o BF in NAD... GENERAL:  Alert & oriented; pleasant & cooperative... HEENT:  [place]/AT, EOM-wnl, PERRLA, Fundi-benign, EACs-clear, TMs-wnl, NOSE-clear, THROAT-clear & wnl. NECK:  Supple w/ full ROM; no JVD; normal carotid impulses w/o bruits; no thyromegaly or nodules palpated; no lymphadenopathy. CHEST:  Clear to P & A; without wheezes/ rales/ or rhonchi. HEART:  Regular Rhythm; without murmurs/ rubs/ or gallops. ABDOMEN:  Soft & nontender; normal bowel sounds; no organomegaly or masses detected. EXT: without deformities or arthritic changes; no varicose veins/ venous insuffic/ or edema. NEURO:  CN's intact; motor testing normal; sensory testing normal; gait normal & balance OK. DERM:  No lesions noted; no rash etc...  RADIOLOGY DATA:  Reviewed in the EPIC EMR & discussed w/ the patient...  LABORATORY DATA:  Reviewed in the EPIC EMR & discussed w/ the patient...   Assessment:      CPX>>  Good general health...  Hx pos PPD>  Her CXR remains clear & she remains asymptomatic; she has completed the years worth of INH...  ?Hx impaired fasting gluc>  It is certainly normal now w/o problems identified...  Hx GYN surg>  Followed by Latanya Presser for GYN...  Sleep disorder>  Related to shift work at [employer];  Ok to refill Ambien...  Hx Anemia>  Normal now...     Plan:                                                MEDs WRITTEN FOR 43mo SUPPLIES... Patient's Medications  New Prescriptions   No medications on file  Previous Medications   DIPHENHYD-HYDROCORT-NYSTATIN (FIRST-DUKES MOUTHWASH) SUSP    Take 5 mLs by mouth 4 (four) times daily as needed.  Swish and gargle   NORETHINDRONE ACETATE-ETHINYL ESTRAD-FE (LOESTRIN 24 FE) 1-20 MG-MCG(24) TABLET    Take 1 tablet by mouth daily.    Modified Medications   Modified Medication Previous Medication   ZOLPIDEM (AMBIEN) 10 MG TABLET zolpidem (AMBIEN) 10 MG tablet      TAKE 1 TABLET BY MOUTH EVERY DAY AT BEDTIME AS NEEDED    Take 1 tablet (10 mg total) by mouth at bedtime as needed.  Discontinued Medications   AZITHROMYCIN (ZITHROMAX) 250 MG TABLET    As directed   FERROUS SULFATE 325 (65 FE) MG TABLET    Take 325 mg by mouth daily with breakfast.

## 2013-08-16 ENCOUNTER — Telehealth: Payer: Self-pay | Admitting: Pulmonary Disease

## 2013-08-16 ENCOUNTER — Other Ambulatory Visit: Payer: Self-pay | Admitting: Pulmonary Disease

## 2013-08-16 DIAGNOSIS — Z0000 Encounter for general adult medical examination without abnormal findings: Secondary | ICD-10-CM

## 2013-08-16 DIAGNOSIS — R7611 Nonspecific reaction to tuberculin skin test without active tuberculosis: Secondary | ICD-10-CM

## 2013-08-16 DIAGNOSIS — IMO0001 Reserved for inherently not codable concepts without codable children: Secondary | ICD-10-CM

## 2013-08-16 DIAGNOSIS — Z862 Personal history of diseases of the blood and blood-forming organs and certain disorders involving the immune mechanism: Secondary | ICD-10-CM

## 2013-08-16 NOTE — Telephone Encounter (Signed)
Discussed with patient SN is retiring from primary care as of August 31 2013 Pt is due for cpx 1.28.16 Pt was given the office numbers to the main PC office, [place], [place] so that she may call at her convenience.  Pt aware she will need to follow up sooner than her 1 yr follow to establish for med refills. Referral to primary care placed for documentation purposes Nothing further needed; will sign off.

## 2013-08-18 NOTE — Telephone Encounter (Signed)
lmomtcb x1 Need to make aware SN [facility] 08[facility]31/13 Need to set up with new PCP Will also need to cancel pending appt 06/29/14

## 2013-08-19 NOTE — Telephone Encounter (Signed)
Pt called back and she is aware that she will need to set up with a new primary care doctor.  She will call back for any assistance that may be needed.  Refill has been called to the pharmacy.

## 2014-01-02 ENCOUNTER — Other Ambulatory Visit: Payer: Self-pay | Admitting: Obstetrics & Gynecology

## 2014-01-03 LAB — CYTOLOGY - PAP

## 2014-02-17 ENCOUNTER — Telehealth: Payer: Self-pay | Admitting: Pulmonary Disease

## 2014-02-17 NOTE — Telephone Encounter (Signed)
Spoke with the pt  I have printed and mailed her immunization record to her per her request Nothing further needed

## 2014-06-06 ENCOUNTER — Telehealth: Payer: Self-pay | Admitting: Pulmonary Disease

## 2014-06-06 MED ORDER — ZOLPIDEM TARTRATE 10 MG PO TABS: ORAL_TABLET | ORAL | Status: DC

## 2014-06-06 NOTE — Telephone Encounter (Signed)
Last OV 06/29/13 Last fill 08/19/13 #90 with 1 refill  Please advise on refill. Thanks.

## 2014-06-06 NOTE — Telephone Encounter (Signed)
Per SN--  Ok for a refill of the ambien  #90  With 1 refill.   rx has been printed out and placed on SN cart to be signed and will fax to the mail order pharmacy.

## 2014-06-06 NOTE — Telephone Encounter (Signed)
Pharmacy requesting refill on Ambien.  Last refill: 08/19/13; Last OV: 06/29/13; no OV scheduled.  Ok to refill?

## 2014-06-06 NOTE — Telephone Encounter (Signed)
Asking for rx for Remus Lofflerambien be sent to mail order [facility]. [phone]

## 2014-06-28 ENCOUNTER — Telehealth: Payer: Self-pay | Admitting: Pulmonary Disease

## 2014-06-28 NOTE — Telephone Encounter (Signed)
Spoke with pharmacist at [facility], verified quantity and refill of pt's zolpidem.  Nothing further needed.

## 2014-06-29 ENCOUNTER — Ambulatory Visit: Payer: BC Managed Care – PPO | Admitting: Pulmonary Disease

## 2014-06-30 ENCOUNTER — Encounter: Payer: Self-pay | Admitting: Family

## 2014-06-30 ENCOUNTER — Ambulatory Visit (INDEPENDENT_AMBULATORY_CARE_PROVIDER_SITE_OTHER): Payer: BLUE CROSS/BLUE SHIELD | Admitting: Family

## 2014-06-30 ENCOUNTER — Ambulatory Visit (HOSPITAL_BASED_OUTPATIENT_CLINIC_OR_DEPARTMENT_OTHER)
Admission: RE | Admit: 2014-06-30 | Discharge: 2014-06-30 | Disposition: A | Discharge status: Home / Self Care | Payer: BLUE CROSS/BLUE SHIELD | Source: Ambulatory Visit | Attending: Family | Admitting: Family

## 2014-06-30 VITALS — BP 112/80 | HR 73 | Temp 98.5°F | Resp 16 | Ht 60.0 in | Wt 154.2 lb

## 2014-06-30 DIAGNOSIS — G4726 Circadian rhythm sleep disorder, shift work type: Secondary | ICD-10-CM

## 2014-06-30 DIAGNOSIS — R7611 Nonspecific reaction to tuberculin skin test without active tuberculosis: Secondary | ICD-10-CM | POA: Insufficient documentation

## 2014-06-30 DIAGNOSIS — Z0000 Encounter for general adult medical examination without abnormal findings: Secondary | ICD-10-CM | POA: Insufficient documentation

## 2014-06-30 LAB — CBC WITH DIFFERENTIAL/PLATELET
BASOS ABS: 0 10*3/uL (ref 0.0–0.1)
Basophils Relative: 0.6 % (ref 0.0–3.0)
EOS ABS: 0.3 10*3/uL (ref 0.0–0.7)
Eosinophils Relative: 3.9 % (ref 0.0–5.0)
HCT: 37.8 % (ref 36.0–46.0)
HEMOGLOBIN: 12.5 g/dL (ref 12.0–15.0)
LYMPHS ABS: 2.7 10*3/uL (ref 0.7–4.0)
Lymphocytes Relative: 40.3 % (ref 12.0–46.0)
MCHC: 33.1 g/dL (ref 30.0–36.0)
MCV: 85.9 fl (ref 78.0–100.0)
MONO ABS: 0.4 10*3/uL (ref 0.1–1.0)
MONOS PCT: 5.9 % (ref 3.0–12.0)
NEUTROS PCT: 49.3 % (ref 43.0–77.0)
Neutro Abs: 3.3 10*3/uL (ref 1.4–7.7)
PLATELETS: 355 10*3/uL (ref 150.0–400.0)
RBC: 4.4 Mil/uL (ref 3.87–5.11)
RDW: 13.1 % (ref 11.5–15.5)
WBC: 6.8 10*3/uL (ref 4.0–10.5)

## 2014-06-30 LAB — HEPATIC FUNCTION PANEL
ALBUMIN: 4.4 g/dL (ref 3.5–5.2)
ALT: 11 U/L (ref 0–35)
AST: 16 U/L (ref 0–37)
Alkaline Phosphatase: 65 U/L (ref 39–117)
BILIRUBIN TOTAL: 0.4 mg/dL (ref 0.2–1.2)
Bilirubin, Direct: 0 mg/dL (ref 0.0–0.3)
TOTAL PROTEIN: 7.5 g/dL (ref 6.0–8.3)

## 2014-06-30 LAB — BASIC METABOLIC PANEL
BUN: 8 mg/dL (ref 6–23)
CO2: 25 meq/L (ref 19–32)
CREATININE: 0.76 mg/dL (ref 0.40–1.20)
Calcium: 9.5 mg/dL (ref 8.4–10.5)
Chloride: 105 mEq/L (ref 96–112)
GFR: 109.39 mL/min (ref >60.00)
Glucose, Bld: 99 mg/dL (ref 70–99)
POTASSIUM: 3.9 meq/L (ref 3.5–5.1)
Sodium: 137 mEq/L (ref 135–145)

## 2014-06-30 LAB — URINALYSIS, ROUTINE W REFLEX MICROSCOPIC
Bilirubin Urine: NEGATIVE
KETONES UR: NEGATIVE
LEUKOCYTES UA: NEGATIVE
NITRITE: NEGATIVE
Specific Gravity, Urine: 1.01 (ref 1.000–1.030)
TOTAL PROTEIN, URINE-UPE24: NEGATIVE
Urine Glucose: NEGATIVE
Urobilinogen, UA: 0.2 (ref 0.0–1.0)
pH: 6.5 (ref 5.0–8.0)

## 2014-06-30 LAB — LIPID PANEL
CHOLESTEROL: 134 mg/dL (ref 0–200)
HDL: 52.2 mg/dL (ref >39.00)
LDL CALC: 68 mg/dL (ref 0–99)
NonHDL: 81.8
TRIGLYCERIDES: 67 mg/dL (ref 0.0–149.0)
Total CHOL/HDL Ratio: 3
VLDL: 13.4 mg/dL (ref 0.0–40.0)

## 2014-06-30 LAB — TSH: TSH: 0.54 u[IU]/mL (ref 0.35–4.50)

## 2014-06-30 NOTE — Patient Instructions (Addendum)
Please complete lab work prior to leaving. Please complete chest x ray on the first floor.  Follow up in 1 year, sooner if problems/concerns.

## 2014-06-30 NOTE — Progress Notes (Signed)
Pre visit review using our clinic review tool, if applicable. No additional management support is needed unless otherwise documented below in the visit note. 

## 2014-06-30 NOTE — Assessment & Plan Note (Signed)
Discussed healthy diet, exercise, weight loss.  Obtain routine lab work.

## 2014-06-30 NOTE — Progress Notes (Signed)
Subjective:    Patient ID: Rose ArmsPhylisha A Hansen, female    DOB: 1976-04-11, 39 y.o.   MRN: [id number]  HPI  Ms. Rose Hansen is a 39 yr old female here today to establish care. She was previously followed by Dr. Alroy DustScott Nadel.   Insomnia-  Pt reports that she takes [place] regularly due to shift work.  Hx PPD positive- reports that she was treated with 365 day INH treatment. She needs annual chest x rays for her job.    Immunizations: up to date Diet: reports that she eats healthy Exercise: just joined a gym Mammogram: had mammogram 2 years ago- normal.      Review of Systems  Constitutional: Negative for unexpected weight change.  HENT: Negative for hearing loss and rhinorrhea.   Eyes: Negative for visual disturbance.  Respiratory: Negative for cough and shortness of breath.   Cardiovascular: Negative for chest pain and leg swelling.  Gastrointestinal: Negative for nausea, vomiting, diarrhea and constipation.  Genitourinary: Negative for dysuria, frequency and menstrual problem.  Musculoskeletal: Negative for myalgias.  Skin: Negative for rash.  Neurological: Negative for headaches.  Hematological: Negative for adenopathy.  Psychiatric/Behavioral:       Denies depression/anxiety   Past Medical History  Diagnosis Date  . Nonspecific reaction to tuberculin skin test without active tuberculosis(795.51)   . Abnormal glucose   . History of UTI   . Fibromyalgia   . Circadian rhythm sleep disorder, shift work type   . Mild anemia     History   Social History  . Marital Status: Single    Spouse Name: N/A    Number of Children: N/A  . Years of Education: N/A   Occupational History  . dialysis nurse at [employer]    Social History Main Topics  . Smoking status: Never Smoker   . Smokeless tobacco: Not on file  . Alcohol Use: No  . Drug Use: Not on file  . Sexual Activity: Not on file   Other Topics Concern  . Not on file   Social History Narrative   Mom- alhzheimers     Single   Dialysis rn   Completed college   No children   Enjoys reading and spending time with neices and nephews    Past Surgical History  Procedure Laterality Date  . Gyn surgery  08/2007    Dr. Jennette KettleNeal for benign cystic teratomas of right ovary,uterine fibroids, follicular cyst of left ovary    Family History  Problem Relation Age of Onset  . Hypertension Father   . Alzheimer's disease Mother   . Asthma Sister     No Known Allergies  Current Outpatient Prescriptions on File Prior to Visit  Medication Sig Dispense Refill  . zolpidem (AMBIEN) 10 MG tablet TAKE 1 TABLET BY MOUTH EVERY DAY AT BEDTIME AS NEEDED 90 tablet 1   No current facility-administered medications on file prior to visit.    BP 112/80 mmHg  Pulse 73  Temp(Src) 98.5 F (36.9 C) (Oral)  Resp 16  Ht 5' (1.524 m)  Wt 154 lb 3.2 oz (69.945 kg)  BMI 30.12 kg/m2  SpO2 99%  LMP 06/28/2014       Objective:   Physical Exam  Physical Exam  Constitutional: She is oriented to person, place, and time. She appears well-developed and well-nourished. No distress.  HENT:  Head: Normocephalic and atraumatic.  Right Ear: Tympanic membrane and ear canal normal.  Left Ear: Tympanic membrane and ear canal normal.  Mouth/Throat: Oropharynx  is clear and moist.  Eyes: Pupils are equal, round, and reactive to light. No scleral icterus.  Neck: Normal range of motion. No thyromegaly present.  Cardiovascular: Normal rate and regular rhythm.   No murmur heard. Pulmonary/Chest: Effort normal and breath sounds normal. No respiratory distress. He has no wheezes. She has no rales. She exhibits no tenderness.  Abdominal: Soft. Bowel sounds are normal. He exhibits no distension and no mass. There is no tenderness. There is no rebound and no guarding.  Musculoskeletal: She exhibits no edema.  Lymphadenopathy:    She has no cervical adenopathy.  Neurological: She is alert and oriented to person, place, and time. She exhibits  normal muscle tone. Coordination normal.  Skin: Skin is warm and dry.  Psychiatric: She has a normal mood and affect. Her behavior is normal. Judgment and thought content normal.  Breasts/pelvic: deferred to GYN        Assessment & Plan:         Assessment & Plan:

## 2014-06-30 NOTE — Assessment & Plan Note (Signed)
Stable with use of ambien. A controlled substance contract is signed today and UDS ordered.

## 2014-06-30 NOTE — Assessment & Plan Note (Signed)
She has completed INH therapy.  Obtain annual cxr per pt request for her job.

## 2014-07-02 ENCOUNTER — Encounter: Payer: Self-pay | Admitting: Family

## 2014-07-26 ENCOUNTER — Encounter: Payer: Self-pay | Admitting: Family

## 2014-11-14 ENCOUNTER — Telehealth: Payer: Self-pay | Admitting: Family

## 2014-11-14 MED ORDER — ZOLPIDEM TARTRATE 10 MG PO TABS: ORAL_TABLET | ORAL | Status: DC

## 2014-11-14 NOTE — Telephone Encounter (Signed)
Caller name: Shaquitta Relation to IW:PYKD Call back number:[phone] Pharmacy:[facility] [address] [phone]  Reason for call: Pt is requesting rx for zolpidem (AMBIEN) 10 MG tablet. Please advise

## 2014-11-14 NOTE — Telephone Encounter (Signed)
Pt last seen 06/2014. CSC and UDS obtained. Pt due for CPE in 06/2015.  Rx printed and forwarded to covering provider, [place] for signature.

## 2014-11-14 NOTE — Telephone Encounter (Signed)
Rx faxed to pharmacy below. Left message on voicemail that request has been taken care of.

## 2014-12-14 ENCOUNTER — Telehealth: Payer: Self-pay | Admitting: Family

## 2014-12-14 MED ORDER — ZOLPIDEM TARTRATE 10 MG PO TABS: ORAL_TABLET | ORAL | Status: DC

## 2014-12-14 NOTE — Telephone Encounter (Signed)
Pt upcoming OV 06/2015. UDS and CSC from 06/2014.  Last Rx 11/14/14.  Rx printed and forwarded to PCP for signature.

## 2014-12-14 NOTE — Telephone Encounter (Signed)
Rx faxed to pharmacy  

## 2014-12-14 NOTE — Telephone Encounter (Signed)
Relation to pt: self  Call back number: [phone] [id number] [place], [place] [address] [facility] [phone]) [phone])         Reason for call:  Pt requesting zolpidem (AMBIEN) 10 MG tablet

## 2015-01-09 ENCOUNTER — Other Ambulatory Visit: Payer: Self-pay | Admitting: Obstetrics & Gynecology

## 2015-01-10 LAB — CYTOLOGY - PAP

## 2015-01-15 ENCOUNTER — Other Ambulatory Visit: Payer: Self-pay | Admitting: Family

## 2015-01-15 MED ORDER — ZOLPIDEM TARTRATE 10 MG PO TABS: ORAL_TABLET | ORAL | Status: DC

## 2015-01-15 NOTE — Telephone Encounter (Signed)
°  Relation to ZO:XWRU Call back number:[phone] [facility]  Reason for call: pt is needing rx zolpidem (AMBIEN) 10 MG tablet

## 2015-01-15 NOTE — Telephone Encounter (Signed)
Last Rx 12/14/14.  Pt was due for UDS in 09/2014 and has not completed. Pt will need to pick up Rx and provide UDS at that time. Pt has f/u with PCP 06/2014.  Rx printed and forwarded to PCP for signature.

## 2015-01-15 NOTE — Telephone Encounter (Signed)
Placed Rx at front desk for pick up. Left message for pt to return my call.

## 2015-01-16 ENCOUNTER — Telehealth: Payer: Self-pay | Admitting: Family

## 2015-01-16 NOTE — Telephone Encounter (Signed)
°  Relation to ZO:XWRU Call back number:[phone] Pharmacy:  Reason for call: pt is returning your call please call back

## 2015-01-16 NOTE — Telephone Encounter (Signed)
See note from 01/15/15.

## 2015-01-16 NOTE — Telephone Encounter (Signed)
Spoke with pt and she verbalizes understanding. Pt states she will come in tomorrow to pick up Rx and take to pharmacy.

## 2015-02-15 ENCOUNTER — Other Ambulatory Visit: Payer: Self-pay | Admitting: Family

## 2015-02-15 NOTE — Telephone Encounter (Signed)
Pt has upcoming appt in 06/2015. Last Rx Filled 01/15/15. Please advise if ok to print Rx.

## 2015-02-15 NOTE — Telephone Encounter (Signed)
Caller name: Relationship to patient: Can be reached: Pharmacy: [id number] [place], [place] - [address]  Reason for call: pt requesting refill of zolpidem. Takes 1 each night. Has 1 or 2 left.

## 2015-02-16 MED ORDER — ZOLPIDEM TARTRATE 10 MG PO TABS: ORAL_TABLET | ORAL | Status: DC

## 2015-02-16 NOTE — Telephone Encounter (Signed)
Rx faxed to pharmacy at 11:53am.

## 2015-02-16 NOTE — Telephone Encounter (Signed)
Left message on cell # that request has been completed and to let us know if she has further concerns.

## 2015-02-16 NOTE — Telephone Encounter (Signed)
Rx printed and forwarded to PCP for signature. 

## 2015-02-18 NOTE — Telephone Encounter (Signed)
Ok to send 30 tabs zero refills. 

## 2015-02-19 NOTE — Telephone Encounter (Signed)
Rx called to pharmacy voicemail. 

## 2015-03-21 ENCOUNTER — Telehealth: Payer: Self-pay | Admitting: Family

## 2015-03-21 MED ORDER — ZOLPIDEM TARTRATE 10 MG PO TABS: ORAL_TABLET | ORAL | Status: DC

## 2015-03-21 NOTE — Telephone Encounter (Signed)
Rx faxed to pharmacy and message left on voicemail that request was completed.

## 2015-03-21 NOTE — Telephone Encounter (Signed)
Pt called for refill on zolpidem. She has 1 left for tonight. She takes 1/day. Please send to [id number]- [place], [place] [address].

## 2015-03-21 NOTE — Telephone Encounter (Signed)
Pt has f/u 07/02/15, last UDS 01/16/15.  Rx printed and forwarded to PCP for signature.

## 2015-04-19 ENCOUNTER — Telehealth: Payer: Self-pay | Admitting: Family

## 2015-04-19 MED ORDER — ZOLPIDEM TARTRATE 10 MG PO TABS: ORAL_TABLET | ORAL | Status: DC

## 2015-04-19 NOTE — Telephone Encounter (Signed)
Notified pt and she voices understanding. 

## 2015-04-19 NOTE — Telephone Encounter (Signed)
Last UDS was 01/16/15 Pt is  (Moderate Risk) Last refill was 03/21/15 Pt has upcoming appt 07/02/2015 Please advise on refill

## 2015-04-19 NOTE — Telephone Encounter (Signed)
Caller name:[place]   Relationship to patient: Self   Can be reached: [phone]  [id number] [place] [address]  Reason for call: pt is [facility] a [facility].

## 2015-04-19 NOTE — Telephone Encounter (Signed)
Ok to send 30 tabs with zero refills.  

## 2015-04-20 NOTE — Telephone Encounter (Signed)
Rx faxed to pharmacy  

## 2015-05-23 ENCOUNTER — Telehealth: Payer: Self-pay | Admitting: Family

## 2015-05-23 MED ORDER — ZOLPIDEM TARTRATE 10 MG PO TABS: ORAL_TABLET | ORAL | Status: DC

## 2015-05-23 NOTE — Telephone Encounter (Signed)
Last Rx 04/19/15. Last UDS 01/2015. Rx printed and forwarded to PCP for signature.

## 2015-05-23 NOTE — Telephone Encounter (Signed)
Relation to UJ:WJXBpt:self Call back number:[phone] [id number] [place], [place] [address] [facility] [phone])         Reason for call:  Patient requesting a refill zolpidem (AMBIEN) 10 MG tablet

## 2015-05-23 NOTE — Telephone Encounter (Signed)
Rx faxed to pharmacy  

## 2015-06-19 ENCOUNTER — Ambulatory Visit: Payer: Self-pay | Admitting: Family

## 2015-06-20 ENCOUNTER — Other Ambulatory Visit: Payer: Self-pay | Admitting: Family

## 2015-06-20 NOTE — Telephone Encounter (Signed)
[name] Rx:  05/23/15 Last UDS:  01/2015 Next OV:  07/06/15.  Please advise request?

## 2015-06-21 MED ORDER — ZOLPIDEM TARTRATE 10 MG PO TABS: ORAL_TABLET | ORAL | Status: DC

## 2015-06-22 NOTE — Telephone Encounter (Signed)
Spoke with pharmacist, she verified that Rx was called in yesterday and pt has already picked it up.

## 2015-07-02 ENCOUNTER — Encounter: Payer: BLUE CROSS/BLUE SHIELD | Admitting: Family

## 2015-07-06 ENCOUNTER — Encounter: Payer: BLUE CROSS/BLUE SHIELD | Admitting: Family

## 2015-07-20 ENCOUNTER — Telehealth: Payer: Self-pay | Admitting: Family

## 2015-07-23 ENCOUNTER — Other Ambulatory Visit: Payer: Self-pay | Admitting: Family

## 2015-07-23 MED ORDER — ZOLPIDEM TARTRATE 10 MG PO TABS: ORAL_TABLET | ORAL | Status: DC

## 2015-07-23 NOTE — Telephone Encounter (Signed)
Last ambien Rx 06/21/15, next UDS due 07/2015 and will collect at upcoming appt in 08/2015. Rx printed and forwarded to PCP for signature.

## 2015-07-23 NOTE — Telephone Encounter (Signed)
Rx faxed to pharmacy  

## 2015-07-23 NOTE — Addendum Note (Signed)
Addended by: Mervin Kung A on: 07/23/2015 09:16 AM   Modules accepted: Orders

## 2015-08-10 ENCOUNTER — Encounter: Payer: Self-pay | Admitting: Behavioral Health

## 2015-08-10 ENCOUNTER — Telehealth: Payer: Self-pay | Admitting: Behavioral Health

## 2015-08-10 NOTE — Addendum Note (Signed)
Addended by: Harold BarbanBYRD, RONECIA E on: 08/10/2015 02:21 PM   Modules accepted: Medications

## 2015-08-10 NOTE — Telephone Encounter (Signed)
Pre-Visit Call completed with patient and chart updated.   Pre-Visit Info documented in Specialty Comments under SnapShot.    

## 2015-08-10 NOTE — Telephone Encounter (Signed)
Unable to reach patient at time of Pre-Visit Call.  Left message for patient to return call when available.    

## 2015-08-13 ENCOUNTER — Encounter: Payer: Self-pay | Admitting: Family

## 2015-08-13 ENCOUNTER — Ambulatory Visit (HOSPITAL_BASED_OUTPATIENT_CLINIC_OR_DEPARTMENT_OTHER)
Admission: RE | Admit: 2015-08-13 | Discharge: 2015-08-13 | Disposition: A | Discharge status: Home / Self Care | Payer: BLUE CROSS/BLUE SHIELD | Source: Ambulatory Visit | Attending: Family | Admitting: Family

## 2015-08-13 ENCOUNTER — Ambulatory Visit (INDEPENDENT_AMBULATORY_CARE_PROVIDER_SITE_OTHER): Payer: BLUE CROSS/BLUE SHIELD | Admitting: Family

## 2015-08-13 VITALS — BP 133/90 | HR 82 | Temp 98.3°F | Resp 16 | Ht 60.0 in | Wt 159.6 lb

## 2015-08-13 DIAGNOSIS — R7611 Nonspecific reaction to tuberculin skin test without active tuberculosis: Secondary | ICD-10-CM

## 2015-08-13 DIAGNOSIS — Z0000 Encounter for general adult medical examination without abnormal findings: Secondary | ICD-10-CM

## 2015-08-13 DIAGNOSIS — R823 Hemoglobinuria: Secondary | ICD-10-CM

## 2015-08-13 LAB — CBC WITH DIFFERENTIAL/PLATELET
BASOS ABS: 0 10*3/uL (ref 0.0–0.1)
BASOS PCT: 0.5 % (ref 0.0–3.0)
Eosinophils Absolute: 0.3 10*3/uL (ref 0.0–0.7)
Eosinophils Relative: 3 % (ref 0.0–5.0)
HEMATOCRIT: 36.5 % (ref 36.0–46.0)
Hemoglobin: 12 g/dL (ref 12.0–15.0)
LYMPHS ABS: 4.3 10*3/uL — AB (ref 0.7–4.0)
Lymphocytes Relative: 47 % — ABNORMAL HIGH (ref 12.0–46.0)
MCHC: 32.9 g/dL (ref 30.0–36.0)
MCV: 87.6 fl (ref 78.0–100.0)
MONOS PCT: 4.4 % (ref 3.0–12.0)
Monocytes Absolute: 0.4 10*3/uL (ref 0.1–1.0)
NEUTROS ABS: 4.2 10*3/uL (ref 1.4–7.7)
NEUTROS PCT: 45.1 % (ref 43.0–77.0)
PLATELETS: 374 10*3/uL (ref 150.0–400.0)
RBC: 4.17 Mil/uL (ref 3.87–5.11)
RDW: 13.9 % (ref 11.5–15.5)
WBC: 9.2 10*3/uL (ref 4.0–10.5)

## 2015-08-13 LAB — URINALYSIS, ROUTINE W REFLEX MICROSCOPIC
Bilirubin Urine: NEGATIVE
Ketones, ur: NEGATIVE
LEUKOCYTES UA: NEGATIVE
Nitrite: NEGATIVE
PH: 6 (ref 5.0–8.0)
Specific Gravity, Urine: <=1.005 — AB (ref 1.000–1.030)
TOTAL PROTEIN, URINE-UPE24: NEGATIVE
URINE GLUCOSE: NEGATIVE
UROBILINOGEN UA: 0.2 (ref 0.0–1.0)
WBC, UA: NONE SEEN (ref 0–?)

## 2015-08-13 LAB — BASIC METABOLIC PANEL
BUN: 6 mg/dL (ref 6–23)
CALCIUM: 9.9 mg/dL (ref 8.4–10.5)
CHLORIDE: 104 meq/L (ref 96–112)
CO2: 26 meq/L (ref 19–32)
Creatinine, Ser: 0.68 mg/dL (ref 0.40–1.20)
GFR: 123.65 mL/min (ref >60.00)
GLUCOSE: 98 mg/dL (ref 70–99)
Potassium: 3.8 mEq/L (ref 3.5–5.1)
SODIUM: 139 meq/L (ref 135–145)

## 2015-08-13 LAB — LIPID PANEL
CHOL/HDL RATIO: 2
Cholesterol: 118 mg/dL (ref 0–200)
HDL: 51.8 mg/dL (ref >39.00)
LDL Cholesterol: 53 mg/dL (ref 0–99)
NONHDL: 65.78
Triglycerides: 66 mg/dL (ref 0.0–149.0)
VLDL: 13.2 mg/dL (ref 0.0–40.0)

## 2015-08-13 LAB — HEPATIC FUNCTION PANEL
ALBUMIN: 4.6 g/dL (ref 3.5–5.2)
ALK PHOS: 54 U/L (ref 39–117)
ALT: 8 U/L (ref 0–35)
AST: 12 U/L (ref 0–37)
Bilirubin, Direct: 0.1 mg/dL (ref 0.0–0.3)
TOTAL PROTEIN: 7.7 g/dL (ref 6.0–8.3)
Total Bilirubin: 0.4 mg/dL (ref 0.2–1.2)

## 2015-08-13 LAB — TSH: TSH: 1.2 u[IU]/mL (ref 0.35–4.50)

## 2015-08-13 NOTE — Patient Instructions (Addendum)
Complete lab work prior to leaving.  Change breakfast to a lowfat yogurt and piece of fruit.   Make your own lunch- Salad with grilled chicken or fish, lowfat dressing.  Or sandwhich on wheat bread with Mal[place]urkey, avoid mayo, fresh fruit. Change lemonade to crystal lemonade, stop mountain dew.  Broiled salmon, steamed broccoli or salad. Snack fresh fruit or veggies, handful of nuts. Add Myfitness Pal App on your smart phone. Set up weight loss goal of 1-2 pounds/week.  Log all your calories.  Try to add 30 minutes 5 days a week of cardio.

## 2015-08-13 NOTE — Addendum Note (Signed)
Addended by: [name]'SULLIVAN, Milton Sagona on: 08/13/2015 12:32 PM   Modules accepted: Orders, SmartSet

## 2015-08-13 NOTE — Progress Notes (Signed)
Pre visit review using our clinic review tool, if applicable. No additional management support is needed unless otherwise documented below in the visit note. 

## 2015-08-13 NOTE — Assessment & Plan Note (Signed)
Discussed healthy diet, exercise and weight loss.  Immunizations reviewed and up to date.  She is PPD + and requests annual cxr for her employer.

## 2015-08-13 NOTE — Progress Notes (Signed)
Subjective:    Patient ID: Rose Hansen, female    DOB: 09-30-1975, 40 y.o.   MRN: [id number]  HPI  Patient presents today for complete physical.  Immunizations: tetanus is up to date.  Diet:reports that weight loss is a struggle.   Wt Readings from Last 3 Encounters:  08/13/15 159 lb 9.6 oz (72.394 kg)  06/30/14 154 lb 3.2 oz (69.945 kg)  06/29/13 147 lb (66.679 kg)   Breakfast: 2 strips Bacon/1 egg/toast (2) Lunch:  [place] restaurant (4 chicken wings/rice) lemonade Dinner:  [employer] salmon broiled, broccoli and cheese. Mountain dew Snack:  Sometimes cookies  Drinks mostly lemonade throughout the day, has cut down on soda.   Exercise: walks at work.   Pap Smear: 8/16- done at GYN, normal per pt Mammogram: 2013.   Dental:  Up date Eye exam: has scheduled    Review of Systems  Constitutional: Positive for unexpected weight change.  HENT: Negative for hearing loss and rhinorrhea.   Eyes: Negative for visual disturbance.  Respiratory: Negative for cough.   Cardiovascular: Negative for leg swelling.  Gastrointestinal: Negative for diarrhea and constipation.  Genitourinary: Negative for dysuria and frequency.  Musculoskeletal: Negative for myalgias and arthralgias.  Skin: Negative for rash.  Neurological: Negative for headaches.  Hematological: Negative for adenopathy.  Psychiatric/Behavioral:       Denies depression/anxiety   Past Medical History  Diagnosis Date  . Nonspecific reaction to tuberculin skin test without active tuberculosis(795.51)   . Abnormal glucose   . History of UTI   . Fibromyalgia   . Circadian rhythm sleep disorder, shift work type   . Mild anemia     Social History   Social History  . Marital Status: Single    Spouse Name: N/A  . Number of Children: N/A  . Years of Education: N/A   Occupational History  . dialysis nurse at [employer]    Social History Main Topics  . Smoking status: Never Smoker   . Smokeless tobacco: Not  on file  . Alcohol Use: No  . Drug Use: Not on file  . Sexual Activity: Not on file   Other Topics Concern  . Not on file   Social History Narrative   Mom- alhzheimers   Single   Dialysis RN    Completed college   No children   Enjoys reading and spending time with neices and nephews    Past Surgical History  Procedure Laterality Date  . Gyn surgery  08/2007    Dr. Jennette KettleNeal for benign cystic teratomas of right ovary,uterine fibroids, follicular cyst of left ovary    Family History  Problem Relation Age of Onset  . Hypertension Father   . Alzheimer's disease Mother   . Asthma Sister     No Known Allergies  Current Outpatient Prescriptions on File Prior to Visit  Medication Sig Dispense Refill  . JUNEL FE 1/20 1-20 MG-MCG tablet Take 1 tablet by mouth daily.    Marland Kitchen. zolpidem (AMBIEN) 10 MG tablet TAKE 1 TABLET BY MOUTH EVERY DAY AT BEDTIME AS NEEDED 30 tablet 0   No current facility-administered medications on file prior to visit.    BP 133/90 mmHg  Pulse 82  Temp(Src) 98.3 F (36.8 C) (Oral)  Resp 16  Ht 5' (1.524 m)  Wt 159 lb 9.6 oz (72.394 kg)  BMI 31.17 kg/m2  SpO2 100%  LMP 07/30/2015       Objective:   Physical Exam  Physical Exam  Constitutional: She is oriented to person, place, and time. She appears well-developed and well-nourished. No distress.  HENT:  Head: Normocephalic and atraumatic.  Right Ear: Tympanic membrane and ear canal normal.  Left Ear: Tympanic membrane and ear canal normal.  Mouth/Throat: Oropharynx is clear and moist.  Eyes: Pupils are equal, round, and reactive to light. No scleral icterus.  Neck: Normal range of motion. No thyromegaly present.  Cardiovascular: Normal rate and regular rhythm.   No murmur heard. Pulmonary/Chest: Effort normal and breath sounds normal. No respiratory distress. He has no wheezes. She has no rales. She exhibits no tenderness.  Abdominal: Soft. Bowel sounds are normal. He exhibits no distension and no  mass. There is no tenderness. There is no rebound and no guarding.  Musculoskeletal: She exhibits no edema.  Lymphadenopathy:    She has no cervical adenopathy.  Neurological: She is alert and oriented to person, place, and time. She has normal reflexes. She exhibits normal muscle tone. Coordination normal.  Skin: Skin is warm and dry.  Psychiatric: She has a normal mood and affect. Her behavior is normal. Judgment and thought content normal.  Breasts: Examined lying Right: Without masses, retractions, discharge or axillary adenopathy.  Left: Without masses, retractions, discharge or axillary adenopathy.  Pelvic: deferred        Assessment & Plan:         Assessment & Plan:

## 2015-08-14 ENCOUNTER — Encounter: Payer: Self-pay | Admitting: Family

## 2015-08-16 ENCOUNTER — Telehealth: Payer: Self-pay | Admitting: Family

## 2015-08-16 ENCOUNTER — Other Ambulatory Visit (INDEPENDENT_AMBULATORY_CARE_PROVIDER_SITE_OTHER): Payer: BLUE CROSS/BLUE SHIELD

## 2015-08-16 DIAGNOSIS — R82998 Other abnormal findings in urine: Secondary | ICD-10-CM

## 2015-08-16 DIAGNOSIS — R8299 Other abnormal findings in urine: Secondary | ICD-10-CM

## 2015-08-16 NOTE — Telephone Encounter (Signed)
Pt asked that copy of imaging results be left at front desk for her. She is coming today for urine sample and said that she needs a copy to take to her work.

## 2015-08-16 NOTE — Telephone Encounter (Signed)
Results placed at front desk as requested. Results are also available on patient's MyChart account.

## 2015-08-17 LAB — URINE CULTURE: Colony Count: 8000

## 2015-08-20 ENCOUNTER — Other Ambulatory Visit: Payer: Self-pay | Admitting: Family

## 2015-08-21 NOTE — Telephone Encounter (Signed)
Ok to send refill, needs UDS please.

## 2015-08-21 NOTE — Telephone Encounter (Signed)
Rx called to pharmacy voicemail. Will obtain UDS at next refill.

## 2015-08-21 NOTE — Telephone Encounter (Signed)
Last OV 08/13/15 Ambien last filled 07/23/15 #30 with 0  UDS moderate risk, due for screen

## 2015-09-19 ENCOUNTER — Telehealth: Payer: Self-pay | Admitting: Family

## 2015-09-19 MED ORDER — ZOLPIDEM TARTRATE 10 MG PO TABS: 10.0000 mg | ORAL_TABLET | Freq: Every evening | ORAL | Status: DC | PRN

## 2015-09-19 NOTE — Telephone Encounter (Signed)
Last zolpidem RF:  08/21/15,  #30 Next UDS was due 07/2015 and will needs to pick up RX and provide UDS at that time.  Rx signed as "phone in" in error. Rx printed and forwarded to PCP for signature.

## 2015-09-21 ENCOUNTER — Other Ambulatory Visit: Payer: Self-pay | Admitting: Family

## 2015-09-21 NOTE — Telephone Encounter (Signed)
See 09/19/15 patient email. Refill already completed.

## 2015-10-02 ENCOUNTER — Telehealth: Payer: Self-pay | Admitting: Family

## 2015-10-02 NOTE — Telephone Encounter (Signed)
Please let pt know that I reviewed her UDS.  It was negative for prescribed ambien, but positive for lorazepam (non-prescribed). Since this violates controlled substance contract, I will no longer be able to prescribe her further controlled substances.

## 2015-10-02 NOTE — Telephone Encounter (Signed)
Notified pt of result. Pt was adamant that she takes zolpidem regularly and has never taken lorazepam ("doesn't know what that is"). Pt states she believes these results are inaccurate and is very upset.  Please advise?

## 2015-10-03 NOTE — Telephone Encounter (Signed)
Spoke with pt and she agrees to repeat UDS today. States she has to work until 5:30pm. Advised her we are open until 7 and she agrees to repeat test today. I labeled specimen cup and gave to front office for pt.

## 2015-10-03 NOTE — Telephone Encounter (Signed)
Please ask her to come leave us another urine sample today.

## 2015-10-03 NOTE — Telephone Encounter (Signed)
Left message for pt to return my call.

## 2015-10-10 ENCOUNTER — Telehealth: Payer: Self-pay | Admitting: Family

## 2015-10-10 NOTE — Telephone Encounter (Signed)
Repeat UDS was not positive for lorazepam this time but was also still negative for Ambien which she states she is taking every day. Per verbal from [employer], even though Remus Lofflerambien was not specified on the testing requisition it would have shown positive if it was in her system. Please look at 3/13 3/14 patient emails and advise re: next steps?

## 2015-10-10 NOTE — Telephone Encounter (Signed)
See 10/02/15 phone note.

## 2015-10-10 NOTE — Telephone Encounter (Signed)
Left message for pt to return my call.

## 2015-10-10 NOTE — Telephone Encounter (Signed)
Noted.  Ambien is not on the routine list of tested medications. Lets repeat UDS in 3 mos. OK to provide rx for ambien in the meantime.

## 2015-10-10 NOTE — Telephone Encounter (Signed)
Pt called in because she says that she completed a urine culture. She says that she still havent received a call back for the results.    CB: [phone]

## 2015-10-12 NOTE — Telephone Encounter (Signed)
Can be reached: [phone]   Reason for call: pt wanting call with UDS results once they are in

## 2015-10-12 NOTE — Telephone Encounter (Signed)
Left a message for call back.  

## 2015-10-16 NOTE — Telephone Encounter (Signed)
Notified pt and she voices understanding. Now states that she doesn't take it every night but bases it on her work schedule. States she will need a refill on Friday.

## 2015-10-16 NOTE — Telephone Encounter (Signed)
Left message for pt to return my call.

## 2015-10-19 MED ORDER — ZOLPIDEM TARTRATE 10 MG PO TABS: 10.0000 mg | ORAL_TABLET | Freq: Every evening | ORAL | Status: DC | PRN

## 2015-10-19 NOTE — Telephone Encounter (Signed)
Ambien Rx printed and forwarded to PCP for signature.

## 2015-10-19 NOTE — Addendum Note (Signed)
Addended by: Mervin KungFERGERSON, Vylette Strubel A on: 10/19/2015 02:52 PM   Modules accepted: Orders

## 2015-10-22 NOTE — Telephone Encounter (Signed)
Rx was faxed to pharmacy at 3:55pm on Friday.

## 2015-11-19 ENCOUNTER — Telehealth: Payer: Self-pay | Admitting: Family

## 2015-11-19 MED ORDER — ZOLPIDEM TARTRATE 10 MG PO TABS: 10.0000 mg | ORAL_TABLET | Freq: Every evening | ORAL | Status: DC | PRN

## 2015-11-19 NOTE — Telephone Encounter (Signed)
Rx faxed to pharmacy. Message sent to pt. 

## 2015-11-19 NOTE — Telephone Encounter (Signed)
Last Zolpidem Rx:  10/19/15, #30 UDS: 10/03/15, moderate Next OV: 08/13/16  Rx printed and forwarded to PCP for signature.

## 2015-11-19 NOTE — Addendum Note (Signed)
Addended by: Mervin KungFERGERSON, Maylon Sailors A on: 11/19/2015 03:30 PM   Modules accepted: Orders

## 2015-11-28 ENCOUNTER — Encounter: Payer: Self-pay | Admitting: Family

## 2015-11-29 ENCOUNTER — Encounter: Payer: Self-pay | Admitting: Family

## 2015-12-19 ENCOUNTER — Other Ambulatory Visit: Payer: Self-pay | Admitting: Family

## 2015-12-19 MED ORDER — ZOLPIDEM TARTRATE 10 MG PO TABS: 10.0000 mg | ORAL_TABLET | Freq: Every evening | ORAL | Status: DC | PRN

## 2015-12-19 NOTE — Telephone Encounter (Signed)
Last Zolpidem RX:  11/19/15, #30 Next OV: 08/18/15 UDS: moderate, 10/08/15. Due 01/2016  Rx printed and forwarded to PCP for signature.

## 2016-01-18 ENCOUNTER — Other Ambulatory Visit: Payer: Self-pay | Admitting: Family

## 2016-01-18 ENCOUNTER — Encounter: Payer: Self-pay | Admitting: Family

## 2016-01-18 MED ORDER — ZOLPIDEM TARTRATE 10 MG PO TABS: 10.0000 mg | ORAL_TABLET | Freq: Every evening | ORAL | 0 refills | Status: DC | PRN

## 2016-01-18 NOTE — Telephone Encounter (Signed)
Last zolpidem rf: 12/19/15, #30 Next OV: 08/13/16 UDS: moderate and UDS due 01/03/16.  Pt will need to pick up Rx and provide UDS at that time. Rx printed and forwarded to PCP for signature.

## 2016-02-18 ENCOUNTER — Other Ambulatory Visit: Payer: Self-pay | Admitting: Family

## 2016-02-18 MED ORDER — ZOLPIDEM TARTRATE 10 MG PO TABS: 10.0000 mg | ORAL_TABLET | Freq: Every evening | ORAL | 0 refills | Status: DC | PRN

## 2016-02-18 NOTE — Telephone Encounter (Signed)
Last zolpidem Rx: 01/18/16 UDS: 01/18/16 Next OV: 08/16/16  Rx printed and forwarded to PCP for signature.

## 2016-02-19 NOTE — Telephone Encounter (Signed)
Rx faxed to pharmacy  

## 2016-03-20 ENCOUNTER — Telehealth: Payer: Self-pay | Admitting: Family

## 2016-03-20 MED ORDER — ZOLPIDEM TARTRATE 10 MG PO TABS: 10.0000 mg | ORAL_TABLET | Freq: Every evening | ORAL | 0 refills | Status: DC | PRN

## 2016-03-20 NOTE — Telephone Encounter (Signed)
Pt is requesting refill on Ambien.  Last OV: 08/13/2015 Last Fill: 02/18/2016 #30 and 0RF  Please advise.

## 2016-03-20 NOTE — Telephone Encounter (Signed)
Ok to give refill, needs ov pls

## 2016-03-20 NOTE — Telephone Encounter (Signed)
[facility], #30 and 0RF.

## 2016-04-14 ENCOUNTER — Encounter: Payer: Self-pay | Admitting: Family

## 2016-04-14 ENCOUNTER — Ambulatory Visit (INDEPENDENT_AMBULATORY_CARE_PROVIDER_SITE_OTHER): Payer: BLUE CROSS/BLUE SHIELD | Admitting: Family

## 2016-04-14 VITALS — BP 131/85 | HR 89 | Temp 98.4°F | Resp 18 | Ht 60.0 in | Wt 163.8 lb

## 2016-04-14 DIAGNOSIS — R1011 Right upper quadrant pain: Secondary | ICD-10-CM

## 2016-04-14 DIAGNOSIS — G4700 Insomnia, unspecified: Secondary | ICD-10-CM | POA: Diagnosis not present

## 2016-04-14 DIAGNOSIS — R112 Nausea with vomiting, unspecified: Secondary | ICD-10-CM

## 2016-04-14 MED ORDER — OMEPRAZOLE 40 MG PO CPDR: 40.0000 mg | DELAYED_RELEASE_CAPSULE | Freq: Every day | ORAL | 4 refills | Status: DC

## 2016-04-14 NOTE — Progress Notes (Signed)
Subjective:    Patient ID: Rose Hansen, female    DOB: 20-Nov-1975, 40 y.o.   MRN: [id number]  HPI  Rose Hansen is a 40 yr old female who presents today for follow up.  Nausea- reports that she had an episode in September after eating a steak and cheese.  Had severe nausea, sob, and bad case of indigestion. Has had some additional similar episodes which were less severe. She reports abdominal bloading. Has some discomfort under the right ribs. Denies black/bloody stools.  vomitted x 1 yesterday. She denies chance of pregnancy.  She is maintained on OCP.   Insomnia- reports that insomnia is well controlled with ambien.   Review of Systems See HPI  Past Medical History:  Diagnosis Date  . Abnormal glucose   . Circadian rhythm sleep disorder, shift work type   . Fibromyalgia   . History of UTI   . Mild anemia   . Nonspecific reaction to tuberculin skin test without active tuberculosis(795.51)      Social History   Social History  . Marital status: Single    Spouse name: N/A  . Number of children: N/A  . Years of education: N/A   Occupational History  . dialysis nurse at [employer]    Social History Main Topics  . Smoking status: Never Smoker  . Smokeless tobacco: Not on file  . Alcohol use No  . Drug use: Unknown  . Sexual activity: Not on file   Other Topics Concern  . Not on file   Social History Narrative   Mom- alhzheimers   Single   Dialysis RN    Completed college   No children   Enjoys reading and spending time with neices and nephews    Past Surgical History:  Procedure Laterality Date  . gyn surgery  08/2007   Dr. Jennette KettleNeal for benign cystic teratomas of right ovary,uterine fibroids, follicular cyst of left ovary    Family History  Problem Relation Age of Onset  . Hypertension Father   . Alzheimer's disease Mother   . Asthma Sister     No Known Allergies  Current Outpatient Prescriptions on File Prior to Visit  Medication Sig Dispense  Refill  . JUNEL FE 1/20 1-20 MG-MCG tablet Take 1 tablet by mouth daily.    Marland Kitchen. zolpidem (AMBIEN) 10 MG tablet Take 1 tablet (10 mg total) by mouth at bedtime as needed. 30 tablet 0   No current facility-administered medications on file prior to visit.     BP 131/85 (BP Location: Right Arm, Cuff Size: Large)   Pulse 89   Temp 98.4 F (36.9 C) (Oral)   Resp 18   Ht 5' (1.524 m)   Wt 163 lb 12.8 oz (74.3 kg)   LMP 03/31/2016   SpO2 100% Comment: room air  BMI 31.99 kg/m       Objective:   Physical Exam  Constitutional: She is oriented to person, place, and time. She appears well-developed and well-nourished.  HENT:  Head: Normocephalic and atraumatic.  Eyes: No scleral icterus.  Cardiovascular: Normal rate, regular rhythm and normal heart sounds.   No murmur heard. Pulmonary/Chest: Effort normal and breath sounds normal. No respiratory distress. She has no wheezes.  Abdominal: Soft. Bowel sounds are normal. She exhibits no distension. There is no tenderness. There is no rebound and negative Murphy's sign.  Musculoskeletal: She exhibits no edema.  Neurological: She is alert and oriented to person, place, and time.  Psychiatric: She has  a normal mood and affect. Her behavior is normal. Judgment and thought content normal.          Assessment & Plan:  RUQ pain- differential includes cholecystitis, Musculoskeletal pain, ulcer/gastritis/gerd.  Will obtain abdominal US to assess GB as well as LFT.  Will obtain H pylori breath test.  Then begin empiric prilosec.   Insomnia- stable on ambien, continue same.

## 2016-04-14 NOTE — Progress Notes (Signed)
Pre visit review using our clinic review tool, if applicable. No additional management support is needed unless otherwise documented below in the visit note. 

## 2016-04-14 NOTE — Patient Instructions (Addendum)
Please schedule a lab draw at the front desk. You be contacted about scheduling your abdominal ultrasound. Call if new/worsening pain.  Begin prilosec after you complete your lab work.

## 2016-04-15 ENCOUNTER — Ambulatory Visit (HOSPITAL_BASED_OUTPATIENT_CLINIC_OR_DEPARTMENT_OTHER)
Admission: RE | Admit: 2016-04-15 | Discharge: 2016-04-15 | Disposition: A | Discharge status: Home / Self Care | Payer: BLUE CROSS/BLUE SHIELD | Source: Ambulatory Visit | Attending: Family | Admitting: Family

## 2016-04-15 ENCOUNTER — Other Ambulatory Visit: Payer: BLUE CROSS/BLUE SHIELD

## 2016-04-15 DIAGNOSIS — R1011 Right upper quadrant pain: Secondary | ICD-10-CM | POA: Insufficient documentation

## 2016-04-15 LAB — CBC WITH DIFFERENTIAL/PLATELET
BASOS ABS: 0 10*3/uL (ref 0.0–0.1)
BASOS PCT: 0.5 % (ref 0.0–3.0)
EOS ABS: 0.3 10*3/uL (ref 0.0–0.7)
Eosinophils Relative: 3.4 % (ref 0.0–5.0)
HEMATOCRIT: 37.4 % (ref 36.0–46.0)
HEMOGLOBIN: 12.4 g/dL (ref 12.0–15.0)
LYMPHS PCT: 45.1 % (ref 12.0–46.0)
Lymphs Abs: 4.2 10*3/uL — ABNORMAL HIGH (ref 0.7–4.0)
MCHC: 33.1 g/dL (ref 30.0–36.0)
MCV: 86.7 fl (ref 78.0–100.0)
Monocytes Absolute: 0.6 10*3/uL (ref 0.1–1.0)
Monocytes Relative: 6.6 % (ref 3.0–12.0)
Neutro Abs: 4.2 10*3/uL (ref 1.4–7.7)
Neutrophils Relative %: 44.4 % (ref 43.0–77.0)
Platelets: 361 10*3/uL (ref 150.0–400.0)
RBC: 4.31 Mil/uL (ref 3.87–5.11)
RDW: 13.5 % (ref 11.5–15.5)
WBC: 9.4 10*3/uL (ref 4.0–10.5)

## 2016-04-15 LAB — HEPATIC FUNCTION PANEL
ALT: 10 U/L (ref 0–35)
AST: 13 U/L (ref 0–37)
Albumin: 4.4 g/dL (ref 3.5–5.2)
Alkaline Phosphatase: 54 U/L (ref 39–117)
BILIRUBIN TOTAL: 0.3 mg/dL (ref 0.2–1.2)
Bilirubin, Direct: 0 mg/dL (ref 0.0–0.3)
TOTAL PROTEIN: 7.5 g/dL (ref 6.0–8.3)

## 2016-04-16 ENCOUNTER — Telehealth: Payer: Self-pay | Admitting: Family

## 2016-04-16 LAB — PREGNANCY, URINE: PREG TEST UR: NEGATIVE

## 2016-04-16 LAB — H. PYLORI BREATH TEST: H. pylori Breath Test: DETECTED — AB

## 2016-04-16 MED ORDER — BIS SUBCIT-METRONID-TETRACYC 140-125-125 MG PO CAPS: 3.0000 | ORAL_CAPSULE | Freq: Three times a day (TID) | ORAL | 0 refills | Status: DC

## 2016-04-16 MED ORDER — OMEPRAZOLE 40 MG PO CPDR: 40.0000 mg | DELAYED_RELEASE_CAPSULE | Freq: Two times a day (BID) | ORAL | 4 refills | Status: DC

## 2016-04-16 NOTE — Telephone Encounter (Signed)
Abdominal US shows normal gallbladder.  H pylori test is positive. I would like her start pylera (antibiotic combo pill) for H pylori for 2 weeks.  She should increase omeprazole to bid for 2 weeks then stop.  Follow up 12/3 as scheduled.

## 2016-04-16 NOTE — Telephone Encounter (Signed)
Attempted to reach pt and received voicemail. Left message to check  mychart acct.  Message sent. 

## 2016-04-18 ENCOUNTER — Other Ambulatory Visit: Payer: Self-pay | Admitting: Family

## 2016-04-18 MED ORDER — ZOLPIDEM TARTRATE 10 MG PO TABS: 10.0000 mg | ORAL_TABLET | Freq: Every evening | ORAL | 0 refills | Status: DC | PRN

## 2016-04-18 NOTE — Telephone Encounter (Signed)
Rx printed and forwarded to PCP for signature. 

## 2016-04-18 NOTE — Telephone Encounter (Signed)
Pt requesting Ambien  Last seen 04/14/16 Last filled 03/20/16 #30-0 rf [id number]:Take 1 tablet (10 mg total) by mouth at bedtime as needed Please advise PC

## 2016-04-20 ENCOUNTER — Emergency Department (HOSPITAL_COMMUNITY)
Admission: EM | Admit: 2016-04-20 | Discharge: 2016-04-20 | Disposition: A | Discharge status: Home / Self Care | Payer: BLUE CROSS/BLUE SHIELD | Attending: Emergency Medicine | Admitting: Emergency Medicine

## 2016-04-20 ENCOUNTER — Encounter (HOSPITAL_COMMUNITY): Payer: Self-pay | Admitting: Emergency Medicine

## 2016-04-20 DIAGNOSIS — T7840XA Allergy, unspecified, initial encounter: Secondary | ICD-10-CM | POA: Diagnosis not present

## 2016-04-20 DIAGNOSIS — Z79899 Other long term (current) drug therapy: Secondary | ICD-10-CM | POA: Diagnosis not present

## 2016-04-20 MED ORDER — PREDNISONE 10 MG PO TABS: 60.0000 mg | ORAL_TABLET | Freq: Every day | ORAL | 0 refills | Status: DC

## 2016-04-20 MED ORDER — RANITIDINE HCL 150 MG PO TABS
150.0000 mg | ORAL_TABLET | Freq: Two times a day (BID) | ORAL | 0 refills | Status: DC
Start: 2016-04-20 — End: 2016-06-23

## 2016-04-20 MED ORDER — FAMOTIDINE 20 MG PO TABS
20.0000 mg | ORAL_TABLET | Freq: Once | ORAL | Status: AC
  Administered 2016-04-20: 20 mg via ORAL
  Filled 2016-04-20: qty 1

## 2016-04-20 MED ORDER — DIPHENHYDRAMINE HCL 25 MG PO CAPS: 25.0000 mg | ORAL_CAPSULE | Freq: Four times a day (QID) | ORAL | 0 refills | Status: DC | PRN

## 2016-04-20 MED ORDER — PREDNISONE 20 MG PO TABS
60.0000 mg | ORAL_TABLET | Freq: Every day | ORAL | Status: DC
  Administered 2016-04-20: 60 mg via ORAL
  Filled 2016-04-20: qty 3

## 2016-04-20 NOTE — ED Provider Notes (Signed)
[facility]-EMERGENCY DEPT Provider Note   CSN: [id number] Arrival date & time: 04/20/16  0548     History   Chief Complaint Chief Complaint  Patient presents with  . Allergic Reaction    HPI Rose Hansen is a 40 y.o. female.  HPI PT comes in with cc of allergic reaction. Pt was recently diagnosed with H.pylori gastritis and started on triple therapy. She reports starting the pills 2 days ago, and noticed some swelling in her face yday. After she took more doses yday, she went to sleep and woke up with swelling in her face. She denies any wheezing, difficulty breathing. There is no known hx of allergic reaction to any meds or foods, and pt hasnt been exposed to anything new besides the meds. PT denies any fevers, chills.  Past Medical History:  Diagnosis Date  . Abnormal glucose   . Circadian rhythm sleep disorder, shift work type   . Fibromyalgia   . History of UTI   . Mild anemia   . Nonspecific reaction to tuberculin skin test without active tuberculosis(795.51)     Patient Active Problem List   Diagnosis Date Noted  . Preventative health care 06/30/2014  . Physical exam, annual 06/29/2013  . Nonspecific reaction to tuberculin skin test without active tuberculosis 06/05/2010  . OTHER ABNORMAL GLUCOSE 03/03/2009  . ANEMIA, MILD, HX OF 03/03/2009  . CIRCADIAN RHYTHM SLEEP DISORDER SHIFT WORK TYPE 03/01/2009  . FIBROMYALGIA 03/31/2007  . HX, URINARY INFECTION 03/31/2007    Past Surgical History:  Procedure Laterality Date  . gyn surgery  08/2007   Dr. Jennette KettleNeal for benign cystic teratomas of right ovary,uterine fibroids, follicular cyst of left ovary    OB History    No data available       Home Medications    Prior to Admission medications   Medication Sig Start Date End Date Taking? Authorizing Provider  JUNEL FE 1/20 1-20 MG-MCG tablet Take 1 tablet by mouth at bedtime.  06/24/14  Yes Historical Provider, MD  omeprazole (PRILOSEC) 40 MG capsule Take 1 capsule  (40 mg total) by mouth 2 (two) times daily. 04/16/16  Yes Sandford CrazeMelissa O'Sullivan, NP  zolpidem (AMBIEN) 10 MG tablet Take 1 tablet (10 mg total) by mouth at bedtime as needed. Patient taking differently: Take 10 mg by mouth at bedtime as needed for sleep.  04/18/16  Yes Sandford CrazeMelissa O'Sullivan, NP  diphenhydrAMINE (BENADRYL) 25 mg capsule Take 1 capsule (25 mg total) by mouth every 6 (six) hours as needed for itching. 04/20/16   Derwood KaplanAnkit Taziyah Iannuzzi, MD  ranitidine (ZANTAC) 150 MG tablet Take 1 tablet (150 mg total) by mouth 2 (two) times daily. 04/20/16   Derwood KaplanAnkit Mitsuo Budnick, MD    Family History Family History  Problem Relation Age of Onset  . Hypertension Father   . Alzheimer's disease Mother   . Asthma Sister     Social History Social History  Substance Use Topics  . Smoking status: Never Smoker  . Smokeless tobacco: Never Used  . Alcohol use No     Allergies   Pylera [bis subcit-metronid-tetracyc]   Review of Systems Review of Systems  ROS 10 Systems reviewed and are negative for acute change except as noted in the HPI.     Physical Exam Updated Vital Signs BP 106/85   Pulse 105   Temp 98.3 F (36.8 C) (Oral)   Resp 20   Ht 4\' 11"  (1.499 m)   Wt 159 lb (72.1 kg)   LMP 03/31/2016  SpO2 100%   BMI 32.11 kg/m   Physical Exam  Constitutional: She is oriented to person, place, and time. She appears well-developed and well-nourished.  HENT:  Head: Normocephalic and atraumatic.  Generalized facial swelling with periorbital edema.  Eyes: EOM are normal. Pupils are equal, round, and reactive to light.  Neck: Neck supple.  Cardiovascular: Normal rate, regular rhythm and normal heart sounds.   No murmur heard. Pulmonary/Chest: Effort normal. No stridor. No respiratory distress. She has no wheezes.  Abdominal: Soft. She exhibits no distension. There is no tenderness. There is no rebound and no guarding.  Neurological: She is alert and oriented to person, place, and time.  Skin:  Skin is warm and dry.  Nursing note and vitals reviewed.    ED Treatments / Results  Labs (all labs ordered are listed, but only abnormal results are displayed) Labs Reviewed - No data to display  EKG  EKG Interpretation None       Radiology No results found.  Procedures Procedures (including critical care time)  Medications Ordered in ED Medications  famotidine (PEPCID) tablet 20 mg (20 mg Oral Given 04/20/16 0618)     Initial Impression / Assessment and Plan / ED Course  I have reviewed the triage vital signs and the nursing notes.  Pertinent labs & imaging results that were available during my care of the patient were reviewed by me and considered in my medical decision making (see chart for details).  Clinical Course as of Apr 29 705  Wynelle LinkSun Apr 20, 2016  0825 Swelling has gone down considerably. Stable for d/c. Strict ER return precautions have been discussed, and patient is agreeing with the plan and is comfortable with the workup done and the recommendations from the ER.   [AN]    Clinical Course User Index [AN] Derwood KaplanAnkit Geetika Laborde, MD    Pt has presumed allergic reaction to the new H. Pylori triple therapy. We will start oral anti-hypersensitivity meds.  Final Clinical Impressions(s) / ED Diagnoses   Final diagnoses:  Allergic reaction, initial encounter    New Prescriptions Discharge Medication List as of 04/20/2016  8:25 AM    START taking these medications   Details  diphenhydrAMINE (BENADRYL) 25 mg capsule Take 1 capsule (25 mg total) by mouth every 6 (six) hours as needed for itching., Starting Sun 04/20/2016, Print    ranitidine (ZANTAC) 150 MG tablet Take 1 tablet (150 mg total) by mouth 2 (two) times daily., Starting Sun 04/20/2016, Print    predniSONE (DELTASONE) 10 MG tablet Take 6 tablets (60 mg total) by mouth daily., Starting Sun 04/20/2016, Print         Derwood KaplanAnkit Hilario Robarts, MD 04/29/16 [phone]

## 2016-04-20 NOTE — ED Triage Notes (Signed)
Pt reports swelling to eyes and shortness of breath that started this morning around 0200 and took Benadryl 50MG  at 0500. Pt reports recently starting on abx for H.Pilori and Prilosec. Pt reports no previous issues with medications or allergies.

## 2016-04-20 NOTE — Discharge Instructions (Signed)
We saw you in the ER after you had the allergic reaction.  The reaction is moderately severe, however, it appears to be in control and there is no increased swelling or any difficulty in breathing noted. Stop the GI medicine and contact the GI doctor. Please take the medications prescribed. PLEASE RETURN TO THE ER IMMEDIATELY IN CASE YOU START HAVING WORSENING SWELLING, DIFFICULTY IN BREATHING ETC.

## 2016-04-21 ENCOUNTER — Telehealth: Payer: Self-pay | Admitting: *Deleted

## 2016-04-21 NOTE — Telephone Encounter (Signed)
Notified pt and she voices understanding. Appt scheduled for 04/28/16 at 6pm.

## 2016-04-21 NOTE — Telephone Encounter (Signed)
Lets have her remain off of abx for now. I would like to see her back in the office in 1 week and we can further discuss at that time.

## 2016-04-21 NOTE — Telephone Encounter (Signed)
Rx was faxed to pharmacy on 04/18/16 at 3:30pm.

## 2016-04-21 NOTE — Telephone Encounter (Signed)
Received call from pt stating she was seen in ER on Sunday for allergic reaction. Was told to call us and see if there was other antibiotics pt could take for her H Pylori. They are unsure which antibiotic caused her reaction. Developed swollen lips on Saturday. Woke up vomiting at 1am on Sunday. Left eye was swollen shut and right swollen as well. Developed swelling in her hands.  Seen in ER and given Prednisone, zantac and benadryl and advised to continue Prilosec. Told her to call us for alternative antibiotics for the H Pylori. States she is still swollen but swelling is some better.  Please advise?

## 2016-04-28 ENCOUNTER — Encounter: Payer: Self-pay | Admitting: Family

## 2016-04-28 ENCOUNTER — Ambulatory Visit (INDEPENDENT_AMBULATORY_CARE_PROVIDER_SITE_OTHER): Payer: BLUE CROSS/BLUE SHIELD | Admitting: Family

## 2016-04-28 VITALS — BP 143/88 | HR 110 | Temp 98.3°F | Resp 20 | Ht 60.0 in | Wt 167.8 lb

## 2016-04-28 DIAGNOSIS — A048 Other specified bacterial intestinal infections: Secondary | ICD-10-CM

## 2016-04-28 NOTE — Progress Notes (Signed)
Pre visit review using our clinic review tool, if applicable. No additional management support is needed unless otherwise documented below in the visit note. 

## 2016-04-28 NOTE — Progress Notes (Signed)
Subjective:    Patient ID: Rose ArmsPhylisha A Hansen, female    DOB: 22-Aug-1975, 40 y.o.   MRN: [id number]  HPI  Ms. Rose Hansen is a 40 yr old female who presents today for follow up.  Last visit she presented with RUQ pain.  US was negative. H pylori breath test was positive.   Unfortunately, she developed an allergic reaction to the pylera and was unable to complete the regimen. She was seen in the ED (note reviewed) and placed on prednisone and zantac.  She reports diarrhea, 3 loose stools/day for the last few days. Abdominal pain is resolved, only had one episode of "reflux."    Review of Systems See HPI  Past Medical History:  Diagnosis Date  . Abnormal glucose   . Circadian rhythm sleep disorder, shift work type   . Fibromyalgia   . History of UTI   . Mild anemia   . Nonspecific reaction to tuberculin skin test without active tuberculosis(795.51)      Social History   Social History  . Marital status: Single    Spouse name: N/A  . Number of children: N/A  . Years of education: N/A   Occupational History  . dialysis nurse at [employer]    Social History Main Topics  . Smoking status: Never Smoker  . Smokeless tobacco: Never Used  . Alcohol use No  . Drug use: Unknown  . Sexual activity: Not on file   Other Topics Concern  . Not on file   Social History Narrative   Mom- alhzheimers   Single   Dialysis RN    Completed college   No children   Enjoys reading and spending time with neices and nephews    Past Surgical History:  Procedure Laterality Date  . gyn surgery  08/2007   Dr. Jennette KettleNeal for benign cystic teratomas of right ovary,uterine fibroids, follicular cyst of left ovary    Family History  Problem Relation Age of Onset  . Hypertension Father   . Alzheimer's disease Mother   . Asthma Sister     Allergies  Allergen Reactions  . Pylera [Bis Subcit-Metronid-Tetracyc] Swelling    Swelling of face, eyes, lips.    Current Outpatient Prescriptions on File  Prior to Visit  Medication Sig Dispense Refill  . diphenhydrAMINE (BENADRYL) 25 mg capsule Take 1 capsule (25 mg total) by mouth every 6 (six) hours as needed for itching. 30 capsule 0  . JUNEL FE 1/20 1-20 MG-MCG tablet Take 1 tablet by mouth at bedtime.     Marland Kitchen. omeprazole (PRILOSEC) 40 MG capsule Take 1 capsule (40 mg total) by mouth 2 (two) times daily. 30 capsule 4  . ranitidine (ZANTAC) 150 MG tablet Take 1 tablet (150 mg total) by mouth 2 (two) times daily. 60 tablet 0  . zolpidem (AMBIEN) 10 MG tablet Take 1 tablet (10 mg total) by mouth at bedtime as needed. (Patient taking differently: Take 10 mg by mouth at bedtime as needed for sleep. ) 30 tablet 0   No current facility-administered medications on file prior to visit.     BP (!) 143/88 (BP Location: Right Arm, Cuff Size: Normal)   Pulse (!) 110   Temp 98.3 F (36.8 C) (Oral)   Resp 20   Ht 5' (1.524 m)   Wt 167 lb 12.8 oz (76.1 kg)   LMP 04/22/2016   SpO2 100% Comment: room air  BMI 32.77 kg/m       Objective:   Physical Exam  Constitutional: She is oriented to person, place, and time. She appears well-developed and well-nourished.  Cardiovascular: Normal rate, regular rhythm and normal heart sounds.   No murmur heard. Pulmonary/Chest: Effort normal and breath sounds normal. No respiratory distress. She has no wheezes.  Abdominal: Soft. Bowel sounds are normal. She exhibits no distension and no mass. There is no tenderness. There is no rebound and no guarding.  Neurological: She is alert and oriented to person, place, and time.  Psychiatric: She has a normal mood and affect. Her behavior is normal. Judgment and thought content normal.          Assessment & Plan:  H pylori- clinically improved.  I am not certain how important completion of a treatment course is in terms of risk/benefit) without further GI evaluation. Will refer to GI for further evaluation. Advised pt to continue zantac 150mg  bid. Let me know if  diarrhea symptoms worsen or do not improve in the next few days.

## 2016-04-28 NOTE — Patient Instructions (Signed)
Continue Zantac. You will be contacted about your referral to GI for further evaluation.

## 2016-04-30 ENCOUNTER — Ambulatory Visit: Payer: BLUE CROSS/BLUE SHIELD | Admitting: Gastroenterology

## 2016-05-09 ENCOUNTER — Encounter: Payer: Self-pay | Admitting: Physician Assistant

## 2016-05-09 ENCOUNTER — Ambulatory Visit (INDEPENDENT_AMBULATORY_CARE_PROVIDER_SITE_OTHER): Payer: BLUE CROSS/BLUE SHIELD | Admitting: Physician Assistant

## 2016-05-09 VITALS — BP 118/78 | HR 76 | Ht 59.0 in | Wt 165.2 lb

## 2016-05-09 DIAGNOSIS — A048 Other specified bacterial intestinal infections: Secondary | ICD-10-CM

## 2016-05-09 DIAGNOSIS — R140 Abdominal distension (gaseous): Secondary | ICD-10-CM

## 2016-05-09 DIAGNOSIS — R197 Diarrhea, unspecified: Secondary | ICD-10-CM

## 2016-05-09 DIAGNOSIS — K219 Gastro-esophageal reflux disease without esophagitis: Secondary | ICD-10-CM

## 2016-05-09 NOTE — Progress Notes (Addendum)
Chief Complaint: H.pylori +, Bloating, GERD  HPI:  Rose Hansen is a 40 year old African-American female with a past medical history of fibromyalgia and mild anemia as well as recent diagnosis of H. pylori, who was referred to me by Debbrah Alar, NP for a complaint of inability to tolerate H. pylori medication, bloating and reflux.     Per chart review patient was seen by her primary care provider on 04/14/16 and at that time H. Pylori breath testing was done as well as an ultrasound for complaint of abdominal pain and bloating. H pylori breath test was positive and patient was started on Pylera. Unfortunately 2 days after taking this medication she ended up in the ED with an allergic reaction of swelling of her face and some shortness of breath. This medication was stopped and the patient was placed on Prednisone as well as Omeprazole 40 mg twice a day and Zantac 150 mg twice a day along with Benadryl. Of note ultrasound was also negative for gallbladder etiology.   Today, patient presents to clinic and tells me that this past summer she developed severe reflux and indigestion symptoms along with some nausea and bloating. She finally presented to her PCP as above in November and was tested for H. pylori which was positive. She was started on Pylera and took this medication for 2 days before having severe reaction. This medication was then stopped as above and patient was placed on omeprazole and Zantac. The patient tells me that currently over the past 2 weeks she has only had 2 episodes of reflux after eating. Overall her symptoms are actually much improved. She does tell me that she has had occasional diarrhea which is increased from her previous baseline over the past 2 weeks since being on antibiotic for a few days.   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    Patient denies fever, chills, blood in her stool, melena, weight loss, fatigue,  anorexia, nausea, vomiting, abdominal pain or symptoms that awaken her at night.  Past Medical History:  Diagnosis Date  . Abnormal glucose   . Circadian rhythm sleep disorder, shift work type   . Fibromyalgia   . History of UTI   . Mild anemia   . Nonspecific reaction to tuberculin skin test without active tuberculosis(795.51)     Past Surgical History:  Procedure Laterality Date  . gyn surgery  08/2007   Dr. Nori Riis for benign cystic teratomas of right ovary,uterine fibroids, follicular cyst of left ovary    Current Outpatient Prescriptions  Medication Sig Dispense Refill  . diphenhydrAMINE (BENADRYL) 25 mg capsule Take 1 capsule (25 mg total) by mouth every 6 (six) hours as needed for itching. 30 capsule 0  . JUNEL FE 1/20 1-20 MG-MCG tablet Take 1 tablet by mouth at bedtime.     Marland Kitchen omeprazole (PRILOSEC) 40 MG capsule Take 1 capsule (40 mg total) by mouth 2 (two) times daily. 30 capsule 4  . ranitidine (ZANTAC) 150 MG tablet Take 1 tablet (150 mg total) by mouth 2 (two) times daily. 60 tablet 0  . zolpidem (AMBIEN) 10 MG tablet Take 1 tablet (10 mg total) by mouth at bedtime as needed. (Patient taking differently: Take 10 mg by mouth at bedtime as needed for sleep. ) 30 tablet 0   No current facility-administered medications for this visit.     Allergies as of 05/09/2016 - Review Complete 05/09/2016  Allergen Reaction Noted  . Pylera [  bis subcit-metronid-tetracyc] Swelling 04/28/2016  . Bismuth-containing compounds Swelling 05/09/2016  . Metronidazole Swelling 05/09/2016  . Tetracyclines & related Swelling 05/09/2016    Family History  Problem Relation Age of Onset  . Hypertension Father   . Colon polyps Father   . Alzheimer's disease Mother   . Asthma Sister   . Colon cancer Maternal Grandfather     Social History   Social History  . Marital status: Single    Spouse name: N/A  . Number of children: 0  . Years of education: N/A   Occupational History  . dialysis  nurse at [employer]  . Smoking status: Never Smoker  . Smokeless tobacco: Never Used  . Alcohol use No  . Drug use: No  . Sexual activity: Not on file   Other Topics Concern  . Not on file   Social History Narrative   Mom- alhzheimers   Single   Dialysis RN    Completed college   No children   Enjoys reading and spending time with neices and nephews    Review of Systems:     Constitutional: No weight loss, fever, chills, weakness or fatigue Skin: No rash  Cardiovascular: No chest pain Respiratory: No SOB  Gastrointestinal: See HPI and otherwise negative Genitourinary: No dysuria or change in urinary frequency Neurological: No headache Musculoskeletal: No new muscle or joint pain Hematologic: No bleeding Psychiatric: No history of depression or anxiety   Physical Exam:  Vital signs: BP 118/78   Pulse 76   Ht 4' 11" (1.499 m)   Wt 165 lb 4 oz (75 kg)   LMP 04/22/2016   BMI 33.38 kg/m   Constitutional:   Pleasant African-American female appears to be in NAD, Well developed, Well nourished, alert and cooperative Head:  Normocephalic and atraumatic. Eyes:   PEERL, EOMI. No icterus. Conjunctiva pink. Ears:  Normal auditory acuity. Neck:  Supple Throat: Oral cavity and pharynx without inflammation, swelling or lesion.  Respiratory: Respirations even and unlabored. Lungs clear to auscultation bilaterally.   No wheezes, crackles, or rhonchi.  Cardiovascular: Normal S1, S2. No MRG. Regular rate and rhythm. No peripheral edema, cyanosis or pallor.  Gastrointestinal:  Soft, nondistended, nontender. No rebound or guarding. Normal bowel sounds. No appreciable masses or hepatomegaly. Rectal:  Not performed.  Msk:  Symmetrical without gross deformities. Without edema, no deformity or joint abnormality.  Neurologic:  Alert and  oriented x4;  grossly normal neurologically.  Skin:   Dry and intact without significant lesions or rashes. Psychiatric:   Demonstrates good judgement and reason without abnormal affect or behaviors.  RELEVANT LABS AND IMAGING: CBC    Component Value Date/Time   WBC 9.4 04/15/2016 1317   RBC 4.31 04/15/2016 1317   HGB 12.4 04/15/2016 1317   HCT 37.4 04/15/2016 1317   PLT 361.0 04/15/2016 1317   MCV 86.7 04/15/2016 1317   MCHC 33.1 04/15/2016 1317   RDW 13.5 04/15/2016 1317   LYMPHSABS 4.2 (H) 04/15/2016 1317   MONOABS 0.6 04/15/2016 1317   EOSABS 0.3 04/15/2016 1317   BASOSABS 0.0 04/15/2016 1317    CMP     Component Value Date/Time   NA 139 08/13/2015 1231   K 3.8 08/13/2015 1231   CL 104 08/13/2015 1231   CO2 26 08/13/2015 1231   GLUCOSE 98 08/13/2015 1231   BUN 6 08/13/2015 1231   CREATININE 0.68 08/13/2015 1231   CALCIUM 9.9 08/13/2015 1231   PROT 7.5 04/15/2016  1317   ALBUMIN 4.4 04/15/2016 1317   AST 13 04/15/2016 1317   ALT 10 04/15/2016 1317   ALKPHOS 54 04/15/2016 1317   BILITOT 0.3 04/15/2016 1317   GFRNONAA 106.29 03/01/2009 1114   GFRAA  03/06/2007 0130    >60        The eGFR has been calculated using the MDRD equation. This calculation has not been validated in all clinical    Assessment: 1. H. pylori positive: Positive breath test 3 weeks ago, patient unable to finish Pylera medication due to extreme reaction of facial swelling which ended up with her in the ER, this medication was stopped after 2 days, likely her H. pylori has not been treated fully, though she has experienced decrease in symptoms on high dose PPI and H2 therapy 2. Bloating: Likely with above and recent antibiotics 3. GERD: Better now on omeprazole 40 twice a day and Zantac 150 twice a day 4. Diarrhea: Patient has had some infrequent diarrhea intermixed with solid stools, likely due to recent antibiotic usage and reaction, recommended she start a daily probiotic  Plan: 1. At this time as patient was unable to complete medication and continues with albeit very infrequent symptoms on high-dose PPI and H2  blocker, would recommend that we proceed with EGD for further evaluation and definitive biopsies especially as she has been having symptoms for 6 mos. Discussed risks, benefits, limitations and alternatives and the patient agrees to proceed. This was scheduled with Dr. Danis as he is the supervising physician. This will occur in the [facility]. 2. Patient would like to try and decrease the amount of medicine she is on, instructed her to titrate/stop Zantac 150 over the next couple of days, if she does have increase in symptoms she can remain on this medicine until after time of EGD. 3. Discussed that if she is truly positive for H. pylori, we will have to pick a different regimen for her in the future to avoid possible side effects.  4. Also recommend the patient start a daily probiotic such as Align, she was given a coupon for this for the next month at least to try and help with her occasional diarrhea after antibiotic usage. 5. Patient to follow in clinic per Dr. Danis's recommendations after time of procedure.   , PA-C [facility] 05/09/2016, 11:07 AM  Cc: O'Sullivan, Melissa, NP   Thank you for sending this case to me. I have reviewed the entire note, and the outlined plan seems appropriate.  Difficult to tell how H. Pylori is related to her symptoms, but must determine if still present and treat if so. Other bloating workup and treatment to follow. 

## 2016-05-09 NOTE — Patient Instructions (Signed)
Continue Omeprazole 40 mg twice daily.  Try to taper off and stop Zantac.   We have given you a coupon for Align, a probioitc.    You have been scheduled for an endoscopy. Please follow written instructions given to you at your visit today. If you use inhalers (even only as needed), please bring them with you on the day of your procedure. Your physician has requested that you go to www.startemmi.com and enter the access code given to you at your visit today. This web site gives a general overview about your procedure. However, you should still follow specific instructions given to you by our office regarding your preparation for the procedure.

## 2016-05-14 ENCOUNTER — Ambulatory Visit: Payer: BLUE CROSS/BLUE SHIELD | Admitting: Family

## 2016-05-16 ENCOUNTER — Other Ambulatory Visit: Payer: Self-pay | Admitting: Family

## 2016-05-16 MED ORDER — ZOLPIDEM TARTRATE 10 MG PO TABS: 10.0000 mg | ORAL_TABLET | Freq: Every evening | ORAL | 0 refills | Status: DC | PRN

## 2016-05-16 NOTE — Telephone Encounter (Signed)
Medication: zolpidem Last ov:  04/28/16 Next OV: 08/13/16 UDS: 04/14/16  Rx printed and forwarded to PCP for signature.

## 2016-05-16 NOTE — Telephone Encounter (Signed)
Rx faxed to pharmacy  

## 2016-06-05 ENCOUNTER — Encounter: Payer: Self-pay | Admitting: Family

## 2016-06-05 ENCOUNTER — Other Ambulatory Visit: Payer: Self-pay | Admitting: Family

## 2016-06-06 DIAGNOSIS — Z0000 Encounter for general adult medical examination without abnormal findings: Secondary | ICD-10-CM | POA: Diagnosis not present

## 2016-06-09 ENCOUNTER — Ambulatory Visit (AMBULATORY_SURGERY_CENTER): Payer: 59 | Admitting: Gastroenterology

## 2016-06-09 ENCOUNTER — Encounter: Payer: Self-pay | Admitting: Gastroenterology

## 2016-06-09 VITALS — BP 113/68 | HR 106 | Temp 97.8°F | Resp 19 | Ht 59.0 in | Wt 165.0 lb

## 2016-06-09 DIAGNOSIS — R140 Abdominal distension (gaseous): Secondary | ICD-10-CM | POA: Diagnosis not present

## 2016-06-09 DIAGNOSIS — R1013 Epigastric pain: Secondary | ICD-10-CM

## 2016-06-09 DIAGNOSIS — K219 Gastro-esophageal reflux disease without esophagitis: Secondary | ICD-10-CM

## 2016-06-09 DIAGNOSIS — K2950 Unspecified chronic gastritis without bleeding: Secondary | ICD-10-CM | POA: Diagnosis not present

## 2016-06-09 MED ORDER — SODIUM CHLORIDE 0.9 % IV SOLN: 500.0000 mL | INTRAVENOUS | Status: DC

## 2016-06-09 NOTE — Progress Notes (Signed)
Called to room to assist during endoscopic procedure.  Patient ID and intended procedure confirmed with present staff. Received instructions for my participation in the procedure from the performing physician.  

## 2016-06-09 NOTE — Progress Notes (Signed)
To PACU, vss patent aw report to rn 

## 2016-06-09 NOTE — Patient Instructions (Signed)
YOU HAD AN ENDOSCOPIC PROCEDURE TODAY AT THE [facility]:   Refer to the procedure report that was given to you for any specific questions about what was found during the examination.  If the procedure report does not answer your questions, please call your gastroenterologist to clarify.  If you requested that your care partner not be given the details of your procedure findings, then the procedure report has been included in a sealed envelope for you to review at your convenience later.  YOU SHOULD EXPECT: Some feelings of bloating in the abdomen. Passage of more gas than usual.  Walking can help get rid of the air that was put into your GI tract during the procedure and reduce the bloating. If you had a lower endoscopy (such as a colonoscopy or flexible sigmoidoscopy) you may notice spotting of blood in your stool or on the toilet paper. If you underwent a bowel prep for your procedure, you may not have a normal bowel movement for a few days.  Please Note:  You might notice some irritation and congestion in your nose or some drainage.  This is from the oxygen used during your procedure.  There is no need for concern and it should clear up in a day or so.  SYMPTOMS TO REPORT IMMEDIATELY:    Following upper endoscopy (EGD)  Vomiting of blood or coffee ground material  New chest pain or pain under the shoulder blades  Painful or persistently difficult swallowing  New shortness of breath  Fever of 100F or higher  Black, tarry-looking stools  For urgent or emergent issues, a gastroenterologist can be reached at any hour by calling [phone].   DIET:  We do recommend a small meal at first, but then you may proceed to your regular diet.  Drink plenty of fluids but you should avoid alcoholic beverages for 24 hours.  Please read all handouts given to you by your recovery nurse.  ACTIVITY:  You should plan to take it easy for the rest of today and you should NOT DRIVE or use heavy  machinery until tomorrow (because of the sedation medicines used during the test).    FOLLOW UP: Our staff will call the number listed on your records the next business day following your procedure to check on you and address any questions or concerns that you may have regarding the information given to you following your procedure. If we do not reach you, we will leave a message.  However, if you are feeling well and you are not experiencing any problems, there is no need to return our call.  We will assume that you have returned to your regular daily activities without incident.  If any biopsies were taken you will be contacted by phone or by letter within the next 1-3 weeks.  Please call us at [phone] if you have not heard about the biopsies in 3 weeks.    SIGNATURES/CONFIDENTIALITY: You and/or your care partner have signed paperwork which will be entered into your electronic medical record.  These signatures attest to the fact that that the information above on your After Visit Summary has been reviewed and is understood.  Full responsibility of the confidentiality of this discharge information lies with you and/or your care-partner.  Thank you for letting us take care of your healthcare needs today. 

## 2016-06-09 NOTE — Op Note (Signed)
[facility] Patient Name: Rose Hansen Procedure Date: 06/09/2016 9:51 AM MRN: [id number] Endoscopist: Sherilyn Cooter L. Myrtie Neither , MD Age: 41 Referring MD:  Date of Birth: Dec 24, 1975 Gender: Female Account #: [id number] Procedure:                Upper GI endoscopy Indications:              Indigestion, Heartburn, Helicobacter pylori (unable                            to complete therapy due to medication reaction) Medicines:                Monitored Anesthesia Care Procedure:                Pre-Anesthesia Assessment:                           - Prior to the procedure, a History and Physical                            was performed, and patient medications and                            allergies were reviewed. The patient's tolerance of                            previous anesthesia was also reviewed. The risks                            and benefits of the procedure and the sedation                            options and risks were discussed with the patient.                            All questions were answered, and informed consent                            was obtained. Prior Anticoagulants: The patient has                            taken no previous anticoagulant or antiplatelet                            agents. ASA Grade Assessment: II - A patient with                            mild systemic disease. After reviewing the risks                            and benefits, the patient was deemed in                            satisfactory condition to undergo the procedure.  After obtaining informed consent, the endoscope was                            passed under direct vision. Throughout the                            procedure, the patient's blood pressure, pulse, and                            oxygen saturations were monitored continuously. The                            Model GIF-HQ190 [id number]) scope was introduced   through the mouth, and advanced to the second part                            of duodenum. The upper GI endoscopy was                            accomplished without difficulty. The patient                            tolerated the procedure well. Scope In: Scope Out: Findings:                 The esophagus was normal.                           The entire examined stomach was normal. Several                            biopsies were obtained in the gastric body and in                            the gastric antrum with cold forceps for histology.                           The cardia and gastric fundus were normal on                            retroflexion.                           The examined duodenum was normal. Complications:            No immediate complications. Estimated Blood Loss:     Estimated blood loss: none. Impression:               - Normal esophagus.                           - Normal stomach.                           - Normal examined duodenum.                           -  Several biopsies were obtained in the gastric                            body and in the gastric antrum. Recommendation:           - Patient has a contact number available for                            emergencies. The signs and symptoms of potential                            delayed complications were discussed with the                            patient. Return to normal activities tomorrow.                            Written discharge instructions were provided to the                            patient.                           - Resume previous diet.                           - Continue present medications.                           - Await pathology results. Kiya Eno L. Myrtie Neitheranis, MD 06/09/2016 10:10:48 AM This report has been signed electronically.

## 2016-06-10 ENCOUNTER — Telehealth: Payer: Self-pay | Admitting: *Deleted

## 2016-06-10 NOTE — Telephone Encounter (Signed)
  Follow up Call-  Call back number 06/09/2016  Post procedure Call Back phone  # [phone]  Permission to leave phone message Yes  Some recent data might be hidden     Patient questions:  Do you have a fever, pain , or abdominal swelling? No. Pain Score  0 *  Have you tolerated food without any problems? Yes.    Have you been able to return to your normal activities? Yes.    Do you have any questions about your discharge instructions: Diet   No. Medications  No. Follow up visit  No.  Do you have questions or concerns about your Care? No.  Actions: * If pain score is 4 or above: No action needed, pain <4.

## 2016-06-13 ENCOUNTER — Encounter: Payer: Self-pay | Admitting: Gastroenterology

## 2016-06-23 ENCOUNTER — Encounter: Payer: Self-pay | Admitting: Medical

## 2016-06-23 ENCOUNTER — Ambulatory Visit (INDEPENDENT_AMBULATORY_CARE_PROVIDER_SITE_OTHER): Payer: 59 | Admitting: Medical

## 2016-06-23 ENCOUNTER — Ambulatory Visit (HOSPITAL_BASED_OUTPATIENT_CLINIC_OR_DEPARTMENT_OTHER)
Admission: RE | Admit: 2016-06-23 | Discharge: 2016-06-23 | Disposition: A | Discharge status: Home / Self Care | Payer: 59 | Source: Ambulatory Visit | Attending: Medical | Admitting: Medical

## 2016-06-23 VITALS — BP 126/76 | HR 112 | Temp 98.2°F | Resp 16 | Ht 60.0 in | Wt 169.0 lb

## 2016-06-23 DIAGNOSIS — R059 Cough, unspecified: Secondary | ICD-10-CM

## 2016-06-23 DIAGNOSIS — R195 Other fecal abnormalities: Secondary | ICD-10-CM

## 2016-06-23 DIAGNOSIS — R6889 Other general symptoms and signs: Secondary | ICD-10-CM

## 2016-06-23 DIAGNOSIS — R05 Cough: Secondary | ICD-10-CM | POA: Insufficient documentation

## 2016-06-23 LAB — POC INFLUENZA A&B (BINAX/QUICKVUE)
INFLUENZA A, POC: POSITIVE — AB
Influenza B, POC: NEGATIVE

## 2016-06-23 LAB — POCT RAPID STREP A (OFFICE): Rapid Strep A Screen: NEGATIVE

## 2016-06-23 MED ORDER — OSELTAMIVIR PHOSPHATE 75 MG PO CAPS
75.0000 mg | ORAL_CAPSULE | Freq: Two times a day (BID) | ORAL | 0 refills | Status: DC
Start: 2016-06-23 — End: 2016-08-13

## 2016-06-23 MED ORDER — AZITHROMYCIN 250 MG PO TABS: ORAL_TABLET | ORAL | 0 refills | Status: DC

## 2016-06-23 MED ORDER — ALBUTEROL SULFATE HFA 108 (90 BASE) MCG/ACT IN AERS: 2.0000 | INHALATION_SPRAY | Freq: Four times a day (QID) | RESPIRATORY_TRACT | 0 refills | Status: DC | PRN

## 2016-06-23 MED ORDER — BENZONATATE 100 MG PO CAPS: 100.0000 mg | ORAL_CAPSULE | Freq: Three times a day (TID) | ORAL | 0 refills | Status: DC | PRN

## 2016-06-23 NOTE — Patient Instructions (Addendum)
Flu syndrome. Will rx tamiflu. Rest hydrate and tylenol for fever.  For rt om by exam and concern for bronchitis rx azithromycin.(possible secondary infection)  For cough will rx benzonatate.  For wheezing or shortness rx albuterol.   For loose stools bland diet guidelines. Immodium and hydrate with propel. If diarrhea persist the would need to order stool panel kit.  Follow up in 7 days or as needed  Please get cxr today

## 2016-06-23 NOTE — Progress Notes (Signed)
Pre visit review using our clinic review tool, if applicable. No additional management support is needed unless otherwise documented below in the visit note/SLS  

## 2016-06-23 NOTE — Progress Notes (Signed)
Subjective:    Patient ID: Rose Hansen, female    DOB: May 07, 1976, 41 y.o.   MRN: [id number]  HPI  Pt in for head congestion, chest congestion, sinus pressure, ear pressure, pnd, body aches, loose stools, nausea, diarrhea ,and low grade fever.  Pt states with severe cough episodes will get sob. No wheezing.  Pt states 2 wks ago got st and then got mild cough. That lingered.   Over last 3 days got diffuse above symptoms.  Pt works as Marine scientist in dialysis center.  LMP- states her menses are once every 3 months. Last was April 16, 2016. On junel(states never takes inactive pills)  Review of Systems  Constitutional: Positive for chills, fatigue and fever.  HENT: Positive for congestion, rhinorrhea, sinus pain and sinus pressure. Negative for postnasal drip.   Respiratory: Positive for cough. Negative for shortness of breath and wheezing.        Sob after severe cough episodes.  Cardiovascular: Negative for chest pain and palpitations.  Gastrointestinal: Negative for abdominal pain.  Musculoskeletal: Positive for myalgias.       2-3 days ago.  Skin: Negative for rash.  Neurological: Negative for dizziness, syncope, weakness and headaches.  Hematological: Negative for adenopathy. Does not bruise/bleed easily.  Psychiatric/Behavioral: Negative for behavioral problems, confusion and self-injury. The patient is not nervous/anxious.    Past Medical History:  Diagnosis Date  . Abnormal glucose   . Circadian rhythm sleep disorder, shift work type   . Fibromyalgia   . History of UTI   . Mild anemia   . Nonspecific reaction to tuberculin skin test without active tuberculosis(795.51)      Social History   Social History  . Marital status: Single    Spouse name: N/A  . Number of children: 0  . Years of education: N/A   Occupational History  . 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 Smoking status: Never Smoker  . Smokeless tobacco: Never Used  .  Alcohol use No  . Drug use: No  . Sexual activity: Not on file   Other Topics Concern  . Not on file   Social History Narrative   Mom- alhzheimers   Single   Dialysis RN    Completed college   No children   Enjoys reading and spending time with neices and nephews    Past Surgical History:  Procedure Laterality Date  . gyn surgery  08/2007   Dr. Nori Riis for benign cystic teratomas of right ovary,uterine fibroids, follicular cyst of left ovary    Family History  Problem Relation Age of Onset  . Hypertension Father   . Colon polyps Father   . Alzheimer's disease Mother   . Asthma Sister   . Colon cancer Maternal Grandfather     Allergies  Allergen Reactions  . Pylera [Bis Subcit-Metronid-Tetracyc] Swelling    Swelling of face, eyes, lips.  . Bismuth-Containing Compounds Swelling  . Metronidazole Swelling  . Tetracyclines & Related Swelling    Current Outpatient Prescriptions on File Prior to Visit  Medication Sig Dispense Refill  . JUNEL FE 1/20 1-20 MG-MCG tablet Take 1 tablet by mouth at bedtime.      Current Facility-Administered Medications on File Prior to Visit  Medication Dose Route Frequency Provider Last Rate Last Dose  . 0.9 %  sodium chloride infusion  500 mL Intravenous Continuous Nelida Meuse III, MD        Ht 5' (1.524 m)  Wt 169 lb (76.7 kg)   LMP 04/23/2016   BMI 33.01 kg/m       Objective:   Physical Exam  General  Mental Status - Alert. General Appearance - Well groomed. Not in acute distress.  Skin Rashes- No Rashes.  HEENT Head- Normal. Ear Auditory Canal - Left- Normal. Right - Normal.Tympanic Membrane- Left- Normal. Right- Normal. Eye Sclera/Conjunctiva- Left- Normal. Right- Normal. Nose & Sinuses Nasal Mucosa- Left-  Boggy and Congested. Right-  Boggy and  Congested.Bilateral maxillary and frontal sinus pressure. Mouth & Throat Lips: Upper Lip- Normal: no dryness, cracking, pallor, cyanosis, or vesicular eruption. Lower  Lip-Normal: no dryness, cracking, pallor, cyanosis or vesicular eruption. Buccal Mucosa- Bilateral- No Aphthous ulcers. Oropharynx- No Discharge or Erythema. Tonsils: Characteristics- Bilateral- No Erythema or Congestion. Size/Enlargement- Bilateral- No enlargement. Discharge- bilateral-None.  Neck Neck- Supple. No Masses.   Chest and Lung Exam Auscultation: Breath Sounds:-Clear even and unlabored.  Cardiovascular Auscultation:Rythm- Regular, rate and rhythm. Murmurs & Other Heart Sounds:Ausculatation of the heart reveal- No Murmurs.  Lymphatic Head & Neck General Head & Neck Lymphatics: Bilateral: Description- No Localized lymphadenopathy.       Assessment & Plan:  Flu syndrome. Will rx tamiflu. Rest hydrate and tylenol for fever.(on review I think I see faint + flu A line)  For rt om by exam and concern for bronchitis rx azithromycin.(possible secondary infection)  For cough will rx benzonatate.  For wheezing or shortness rx albuterol  For loose stools bland diet guidelines. Immodium and hydrate with propel. If diarrhea persist the would need to order stool panel kit.  Follow up in 7 days or as needed  Rose Hansen, Rose Hansen, [employer]

## 2016-06-24 ENCOUNTER — Telehealth: Payer: Self-pay | Admitting: Family

## 2016-06-24 NOTE — Telephone Encounter (Signed)
Patient called regarding results for her upper endoscopy. Please advise  Phone: [phone]

## 2016-06-24 NOTE — Telephone Encounter (Signed)
Spoke with pt and advised her that she should contact the specialist that performed the endoscopy for those results. We do not release them since we did not do the test. Pt voices understanding.

## 2016-06-25 ENCOUNTER — Telehealth: Payer: Self-pay | Admitting: Gastroenterology

## 2016-06-25 NOTE — Telephone Encounter (Signed)
Patient had not received letter with her results. I let her know that stomach biopsy was negative. Re-mailed her the letter.

## 2016-08-13 ENCOUNTER — Encounter: Payer: Self-pay | Admitting: Family

## 2016-08-13 ENCOUNTER — Ambulatory Visit (INDEPENDENT_AMBULATORY_CARE_PROVIDER_SITE_OTHER): Payer: 59 | Admitting: Family

## 2016-08-13 ENCOUNTER — Ambulatory Visit (HOSPITAL_BASED_OUTPATIENT_CLINIC_OR_DEPARTMENT_OTHER)
Admission: RE | Admit: 2016-08-13 | Discharge: 2016-08-13 | Disposition: A | Discharge status: Home / Self Care | Payer: 59 | Source: Ambulatory Visit | Attending: Family | Admitting: Family

## 2016-08-13 DIAGNOSIS — Z0000 Encounter for general adult medical examination without abnormal findings: Secondary | ICD-10-CM | POA: Diagnosis not present

## 2016-08-13 DIAGNOSIS — Z9289 Personal history of other medical treatment: Secondary | ICD-10-CM

## 2016-08-13 DIAGNOSIS — R7611 Nonspecific reaction to tuberculin skin test without active tuberculosis: Secondary | ICD-10-CM

## 2016-08-13 LAB — HEPATIC FUNCTION PANEL
ALT: 37 U/L — ABNORMAL HIGH (ref 0–35)
AST: 41 U/L — ABNORMAL HIGH (ref 0–37)
Albumin: 4.3 g/dL (ref 3.5–5.2)
Alkaline Phosphatase: 50 U/L (ref 39–117)
BILIRUBIN DIRECT: 0.1 mg/dL (ref 0.0–0.3)
TOTAL PROTEIN: 7.4 g/dL (ref 6.0–8.3)
Total Bilirubin: 0.5 mg/dL (ref 0.2–1.2)

## 2016-08-13 LAB — CBC WITH DIFFERENTIAL/PLATELET
BASOS PCT: 0.6 % (ref 0.0–3.0)
Basophils Absolute: 0 10*3/uL (ref 0.0–0.1)
EOS ABS: 0.2 10*3/uL (ref 0.0–0.7)
EOS PCT: 3.4 % (ref 0.0–5.0)
HEMATOCRIT: 37.8 % (ref 36.0–46.0)
Hemoglobin: 12.5 g/dL (ref 12.0–15.0)
LYMPHS PCT: 44.7 % (ref 12.0–46.0)
Lymphs Abs: 3.2 10*3/uL (ref 0.7–4.0)
MCHC: 33.2 g/dL (ref 30.0–36.0)
MCV: 87.8 fl (ref 78.0–100.0)
MONO ABS: 0.4 10*3/uL (ref 0.1–1.0)
Monocytes Relative: 5.5 % (ref 3.0–12.0)
NEUTROS ABS: 3.3 10*3/uL (ref 1.4–7.7)
Neutrophils Relative %: 45.8 % (ref 43.0–77.0)
PLATELETS: 336 10*3/uL (ref 150.0–400.0)
RBC: 4.3 Mil/uL (ref 3.87–5.11)
RDW: 13.8 % (ref 11.5–15.5)
WBC: 7.1 10*3/uL (ref 4.0–10.5)

## 2016-08-13 LAB — LIPID PANEL
CHOL/HDL RATIO: 3
CHOLESTEROL: 117 mg/dL (ref 0–200)
HDL: 42.4 mg/dL (ref >39.00)
LDL Cholesterol: 56 mg/dL (ref 0–99)
NonHDL: 74.92
TRIGLYCERIDES: 97 mg/dL (ref 0.0–149.0)
VLDL: 19.4 mg/dL (ref 0.0–40.0)

## 2016-08-13 LAB — BASIC METABOLIC PANEL
BUN: 12 mg/dL (ref 6–23)
CHLORIDE: 106 meq/L (ref 96–112)
CO2: 26 meq/L (ref 19–32)
CREATININE: 0.83 mg/dL (ref 0.40–1.20)
Calcium: 9.8 mg/dL (ref 8.4–10.5)
GFR: 97.74 mL/min (ref >60.00)
Glucose, Bld: 145 mg/dL — ABNORMAL HIGH (ref 70–99)
Potassium: 4.5 mEq/L (ref 3.5–5.1)
Sodium: 139 mEq/L (ref 135–145)

## 2016-08-13 LAB — URINALYSIS, ROUTINE W REFLEX MICROSCOPIC
BILIRUBIN URINE: NEGATIVE
Ketones, ur: NEGATIVE
Leukocytes, UA: NEGATIVE
Nitrite: NEGATIVE
PH: 6 (ref 5.0–8.0)
SPECIFIC GRAVITY, URINE: 1.025 (ref 1.000–1.030)
TOTAL PROTEIN, URINE-UPE24: NEGATIVE
UROBILINOGEN UA: 0.2 (ref 0.0–1.0)
Urine Glucose: NEGATIVE

## 2016-08-13 LAB — TSH: TSH: 0.7 u[IU]/mL (ref 0.35–4.50)

## 2016-08-13 NOTE — Progress Notes (Signed)
Pre visit review using our clinic review tool, if applicable. No additional management support is needed unless otherwise documented below in the visit note. positive

## 2016-08-13 NOTE — Patient Instructions (Addendum)
Here is a sample diet for you.   Oatmeal 1 serving, fresh berries, use stevia or splenda to sweeten [place] sandwich with mustard, fresh piece of fruit Dinner- grilled chicken or fish, steamed vegetable, one carb serving (small potato, piece of bread, 1/2 cup rice or pasta) Snack- carrot sticks, fresh fruit, or low fat cheese stick  Drinks crystal light or water.   Avoid sugared beverages Work on increasing the frequency of your exercise to 3-5 days a week.

## 2016-08-13 NOTE — Progress Notes (Signed)
Subjective:    Patient ID: Rose Hansen, female    DOB: Feb 20, 1976, 41 y.o.   MRN: [id number]  HPI  Ms. Rose Hansen is a 41 yr old female who presents today for cpx.  Immunizations: tetanus and flu shot up to date.  Diet:  Working on a healthy diet Reports typical diet is as below:   Breakfast, english muffin with butter and cream cheese string cheese, apple slices, package of peanuts Lunch- [place] sandwich, pringles, pickle, potato salad. Dinner- hamburger, chips, 2-3 cookies Occasional evening snack- beef jerky Wt Readings from Last 3 Encounters:  08/13/16 173 lb 9.6 oz (78.7 kg)  06/23/16 169 lb (76.7 kg)  06/09/16 165 lb (74.8 kg)  Exercise: 2 x a week Dental: up to date Vision: will schedule  Hx of PPD +- requests x ray for work.  Previously completed INH therapy.    Review of Systems  Constitutional: Negative for unexpected weight change.  HENT: Negative for hearing loss.   Eyes: Negative for visual disturbance.  Respiratory: Negative for cough and shortness of breath.   Cardiovascular: Negative for chest pain and leg swelling.  Gastrointestinal: Negative for constipation and diarrhea.  Genitourinary: Negative for dysuria and frequency.  Musculoskeletal: Negative for arthralgias and myalgias.  Skin: Negative for rash.  Neurological: Negative for headaches.  Hematological: Negative for adenopathy.  Psychiatric/Behavioral:       Denies depression/anxiety   Past Medical History:  Diagnosis Date  . Abnormal glucose   . Circadian rhythm sleep disorder, shift work type   . Fibromyalgia   . History of UTI   . Mild anemia   . Nonspecific reaction to tuberculin skin test without active tuberculosis(795.51)      Social History   Social History  . Marital status: Single    Spouse name: N/A  . Number of children: 0  . Years of education: N/A   Occupational History  . dialysis nurse at [employer]    Social History Main Topics  . Smoking status: Never  Smoker  . Smokeless tobacco: Never Used  . Alcohol use No  . Drug use: No  . Sexual activity: Not on file   Other Topics Concern  . Not on file   Social History Narrative   Mom- alhzheimers   Single   Dialysis RN    Completed college   No children   Enjoys reading and spending time with neices and nephews    Past Surgical History:  Procedure Laterality Date  . gyn surgery  08/2007   Dr. Jennette Kettle for benign cystic teratomas of right ovary,uterine fibroids, follicular cyst of left ovary    Family History  Problem Relation Age of Onset  . Hypertension Father   . Colon polyps Father   . Alzheimer's disease Mother     end stage dementia  . Asthma Sister   . Colon cancer Maternal Grandfather     Allergies  Allergen Reactions  . Pylera [Bis Subcit-Metronid-Tetracyc] Swelling    Swelling of face, eyes, lips.  . Bismuth-Containing Compounds Swelling  . Metronidazole Swelling  . Tetracyclines & Related Swelling    Current Outpatient Prescriptions on File Prior to Visit  Medication Sig Dispense Refill  . JUNEL FE 1/20 1-20 MG-MCG tablet Take 1 tablet by mouth at bedtime.      Current Facility-Administered Medications on File Prior to Visit  Medication Dose Route Frequency Provider Last Rate Last Dose  . 0.9 %  sodium chloride infusion  500 mL Intravenous Continuous Sherilyn Cooter  L Danis III, MD        BP 134/82 (BP Location: Left Arm, Patient Position: Sitting, Cuff Size: Large)   Pulse 86   Temp 98.7 F (37.1 C) (Oral)   Resp 16   Ht 5\' 1"  (1.549 m)   Wt 173 lb 9.6 oz (78.7 kg)   LMP 07/28/2016   SpO2 99%   BMI 32.80 kg/m       Objective:   Physical Exam  Physical Exam  Constitutional: She is oriented to person, place, and time. She appears well-developed and well-nourished. No distress.  HENT:  Head: Normocephalic and atraumatic.  Right Ear: Tympanic membrane and ear canal normal.  Left Ear: Tympanic membrane and ear canal normal.  Mouth/Throat: Oropharynx is  clear and moist.  Eyes: Pupils are equal, round, and reactive to light. No scleral icterus.  Neck: Normal range of motion. No thyromegaly present.  Cardiovascular: Normal rate and regular rhythm.   No murmur heard. Pulmonary/Chest: Effort normal and breath sounds normal. No respiratory distress. He has no wheezes. She has no rales. She exhibits no tenderness.  Abdominal: Soft. Bowel sounds are normal. She exhibits no distension and no mass. There is no tenderness. There is no rebound and no guarding.  Musculoskeletal: She exhibits no edema.  Lymphadenopathy:    She has no cervical adenopathy.  Neurological: She is alert and oriented to person, place, and time. She has normal patellar reflexes. She exhibits normal muscle tone. Coordination normal.  Skin: Skin is warm and dry.  Psychiatric: She has a normal mood and affect. Her behavior is normal. Judgment and thought content normal.  Breast/pelvic: deferred         Assessment & Plan:         Assessment & Plan:  Preventative care- immunizations reviewed and up to date. Obtain routine lab work. Discussed healthy diet, exercise and weight loss.  Discussed dietary changes as outlined in AVS.  Refer for mammogram and obtain follow up chest xray. Pap up to date. Complete routine lab work.

## 2016-08-14 ENCOUNTER — Telehealth: Payer: Self-pay | Admitting: Family

## 2016-08-14 DIAGNOSIS — R7989 Other specified abnormal findings of blood chemistry: Secondary | ICD-10-CM

## 2016-08-14 DIAGNOSIS — R945 Abnormal results of liver function studies: Secondary | ICD-10-CM

## 2016-08-14 NOTE — Telephone Encounter (Signed)
Liver function testing mildly abnormal.  I would like her to complete an ultrasound to check her for fatty liver.  Can we add on acute hep panel?  Or ask her to return to lab? Sugar is elevated. Lets add on A1C, dx hyperglycemia. Cholesterol, thyroid and blood count look good. + blood in urine. Did she have her period that day?

## 2016-08-15 ENCOUNTER — Telehealth: Payer: Self-pay | Admitting: Family

## 2016-08-15 ENCOUNTER — Encounter: Payer: Self-pay | Admitting: Family

## 2016-08-15 ENCOUNTER — Other Ambulatory Visit (INDEPENDENT_AMBULATORY_CARE_PROVIDER_SITE_OTHER): Payer: 59

## 2016-08-15 DIAGNOSIS — R739 Hyperglycemia, unspecified: Secondary | ICD-10-CM | POA: Diagnosis not present

## 2016-08-15 DIAGNOSIS — E1169 Type 2 diabetes mellitus with other specified complication: Secondary | ICD-10-CM | POA: Insufficient documentation

## 2016-08-15 DIAGNOSIS — E119 Type 2 diabetes mellitus without complications: Secondary | ICD-10-CM

## 2016-08-15 HISTORY — DX: Type 2 diabetes mellitus without complications: E11.9

## 2016-08-15 LAB — HEMOGLOBIN A1C: Hgb A1c MFr Bld: 6.5 % (ref 4.6–6.5)

## 2016-08-15 NOTE — Telephone Encounter (Signed)
Tired to reach pt. left a message on voicemail to call back.  

## 2016-08-15 NOTE — Telephone Encounter (Signed)
Spoke with pt about lab results pt voiced understanding. Notified pt that we are going to try and add on lab work  if pt needs to come back in for labs we will call and set up lab appointment. Notified pt PCP wants her to have urtlasound radiology will call pt to set up appointment.

## 2016-08-15 NOTE — Addendum Note (Signed)
Addended by: Mervin KungFERGERSON, Alyah Boehning A on: 08/15/2016 01:52 PM   Modules accepted: Orders

## 2016-08-15 NOTE — Telephone Encounter (Signed)
Notified pt and she voices understanding. 

## 2016-08-15 NOTE — Telephone Encounter (Signed)
Received call from lab. They are unable to add hepatitis panel. Spoke with pt and scheduled u/s and lab for 08/21/16 at 2:30 and 3:15pm. Future lab order entered.

## 2016-08-15 NOTE — Telephone Encounter (Signed)
Please contact pt and let her know that her blood work shows diabetes. Please work on diet/exercise/weight loss.  Follow up in 3 months.

## 2016-08-21 ENCOUNTER — Ambulatory Visit (HOSPITAL_BASED_OUTPATIENT_CLINIC_OR_DEPARTMENT_OTHER)
Admission: RE | Admit: 2016-08-21 | Discharge: 2016-08-21 | Disposition: A | Discharge status: Home / Self Care | Payer: 59 | Source: Ambulatory Visit | Attending: Family | Admitting: Family

## 2016-08-21 ENCOUNTER — Other Ambulatory Visit (INDEPENDENT_AMBULATORY_CARE_PROVIDER_SITE_OTHER): Payer: 59

## 2016-08-21 DIAGNOSIS — R7989 Other specified abnormal findings of blood chemistry: Secondary | ICD-10-CM

## 2016-08-21 DIAGNOSIS — Z0000 Encounter for general adult medical examination without abnormal findings: Secondary | ICD-10-CM

## 2016-08-21 DIAGNOSIS — Z1231 Encounter for screening mammogram for malignant neoplasm of breast: Secondary | ICD-10-CM | POA: Diagnosis present

## 2016-08-21 DIAGNOSIS — R945 Abnormal results of liver function studies: Secondary | ICD-10-CM

## 2016-08-22 ENCOUNTER — Encounter: Payer: Self-pay | Admitting: Family

## 2016-08-22 DIAGNOSIS — K760 Fatty (change of) liver, not elsewhere classified: Secondary | ICD-10-CM | POA: Insufficient documentation

## 2016-08-22 HISTORY — DX: Fatty (change of) liver, not elsewhere classified: K76.0

## 2016-08-22 LAB — HEPATITIS PANEL, ACUTE
HCV Ab: NEGATIVE
Hep A IgM: NONREACTIVE
Hep B C IgM: NONREACTIVE
Hepatitis B Surface Ag: NEGATIVE

## 2017-01-15 DIAGNOSIS — Z01419 Encounter for gynecological examination (general) (routine) without abnormal findings: Secondary | ICD-10-CM | POA: Diagnosis not present

## 2017-02-13 ENCOUNTER — Telehealth: Payer: Self-pay | Admitting: Family

## 2017-02-13 ENCOUNTER — Encounter: Payer: Self-pay | Admitting: Family

## 2017-02-13 ENCOUNTER — Ambulatory Visit (INDEPENDENT_AMBULATORY_CARE_PROVIDER_SITE_OTHER): Payer: 59 | Admitting: Family

## 2017-02-13 VITALS — BP 124/78 | HR 82 | Temp 98.7°F | Resp 16 | Ht 61.0 in | Wt 160.0 lb

## 2017-02-13 DIAGNOSIS — E119 Type 2 diabetes mellitus without complications: Secondary | ICD-10-CM

## 2017-02-13 DIAGNOSIS — Z23 Encounter for immunization: Secondary | ICD-10-CM

## 2017-02-13 LAB — HEMOGLOBIN A1C: Hgb A1c MFr Bld: 5.9 % (ref 4.6–6.5)

## 2017-02-13 LAB — MICROALBUMIN / CREATININE URINE RATIO
CREATININE, U: 46.8 mg/dL
Microalb Creat Ratio: 1.5 mg/g (ref 0.0–30.0)

## 2017-02-13 LAB — BASIC METABOLIC PANEL
BUN: 8 mg/dL (ref 6–23)
CO2: 26 mEq/L (ref 19–32)
CREATININE: 0.82 mg/dL (ref 0.40–1.20)
Calcium: 9.6 mg/dL (ref 8.4–10.5)
Chloride: 105 mEq/L (ref 96–112)
GFR: 98.87 mL/min (ref >60.00)
GLUCOSE: 115 mg/dL — AB (ref 70–99)
Potassium: 4.1 mEq/L (ref 3.5–5.1)
Sodium: 138 mEq/L (ref 135–145)

## 2017-02-13 LAB — LIPID PANEL
CHOLESTEROL: 113 mg/dL (ref 0–200)
HDL: 52 mg/dL (ref >39.00)
LDL CALC: 48 mg/dL (ref 0–99)
NONHDL: 61.05
Total CHOL/HDL Ratio: 2
Triglycerides: 65 mg/dL (ref 0.0–149.0)
VLDL: 13 mg/dL (ref 0.0–40.0)

## 2017-02-13 NOTE — Patient Instructions (Signed)
Please complete lab work prior to leaving.   

## 2017-02-13 NOTE — Progress Notes (Signed)
Subjective:    Patient ID: Rose Hansen, female    DOB: 05/10/1976, 41 y.o.   MRN: [id number]  HPI  Rose Hansen is a 41 yr old female who presents today for follow up.  DM2-  Lab Results  Component Value Date   HGBA1C 6.5 08/15/2016   Lab Results  Component Value Date   LDLCALC 56 08/13/2016   CREATININE 0.83 08/13/2016   Wt Readings from Last 3 Encounters:  02/13/17 160 lb (72.6 kg)  08/13/16 173 lb 9.6 oz (78.7 kg)  06/23/16 169 lb (76.7 kg)      Review of Systems See HPI  Past Medical History:  Diagnosis Date  . Abnormal glucose   . Circadian rhythm sleep disorder, shift work type   . Diabetes type 2, controlled (HCC) 08/15/2016  . Fatty liver 08/22/2016  . Fibromyalgia   . History of UTI   . Mild anemia   . Nonspecific reaction to tuberculin skin test without active tuberculosis(795.51)      Social History   Social History  . Marital status: Single    Spouse name: N/A  . Number of children: 0  . Years of education: N/A   Occupational History  . dialysis nurse at [employer]    Social History Main Topics  . Smoking status: Never Smoker  . Smokeless tobacco: Never Used  . Alcohol use No  . Drug use: No  . Sexual activity: Not on file   Other Topics Concern  . 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 gyn surgery  08/2007   Dr. Jennette Kettle for benign cystic teratomas of right ovary,uterine fibroids, follicular cyst of left ovary    Family History  Problem Relation Age of Onset  . Hypertension Father   . Colon polyps Father   . Alzheimer's disease Mother        end stage dementia  . Asthma Sister   . Colon cancer Maternal Grandfather     Allergies  Allergen Reactions  . Pylera [Bis Subcit-Metronid-Tetracyc]  Swelling    Swelling of face, eyes, lips.  . Bismuth-Containing Compounds Swelling  . Metronidazole Swelling  . Tetracyclines & Related Swelling    Current Outpatient Prescriptions on File Prior to Visit  Medication Sig Dispense Refill  . JUNEL FE 1/20 1-20 MG-MCG tablet Take 1 tablet by mouth at bedtime.      Current Facility-Administered Medications on File Prior to Visit  Medication Dose Route Frequency Provider Last Rate Last Dose  . 0.9 %  sodium chloride infusion  500 mL Intravenous Continuous Danis, Starr [place] III, MD        BP 124/78 (BP Location: Left Arm, Cuff Size: Normal)   Pulse 82   Temp 98.7 F (37.1 C) (Oral)   Resp 16   Ht  (1.549 m)   Wt 160 lb (72.6 kg)   LMP 12/29/2016   SpO2 100%   BMI 30.23 kg/m       Objective:   Physical Exam  Constitutional: She appears well-developed and well-nourished.  HENT:  Head: Normocephalic and atraumatic.  Cardiovascular: Normal rate, regular rhythm and normal heart sounds.   No murmur heard. Pulmonary/Chest: Effort normal and breath sounds normal. No respiratory  distress. She has no wheezes.  Musculoskeletal: She exhibits no edema.  Neurological: She is alert.  Psychiatric: She has a normal mood and affect. Her behavior is normal. Judgment and thought content normal.          Assessment & Plan:

## 2017-02-13 NOTE — Assessment & Plan Note (Signed)
Encouraged pt to continue healthy diet, exercise and weight loss. Obtain follow up A1C, Urine microalbumin, lipids.

## 2017-02-13 NOTE — Telephone Encounter (Signed)
Opened in error

## 2017-03-26 IMAGING — DX DG CHEST 2V
2 series · 2 of 2 positions shown · non-contrast
Comparison: June 30, 2014.

CLINICAL DATA: Positive PPD test.

EXAM:
CHEST  2 VIEW

[chest pa]
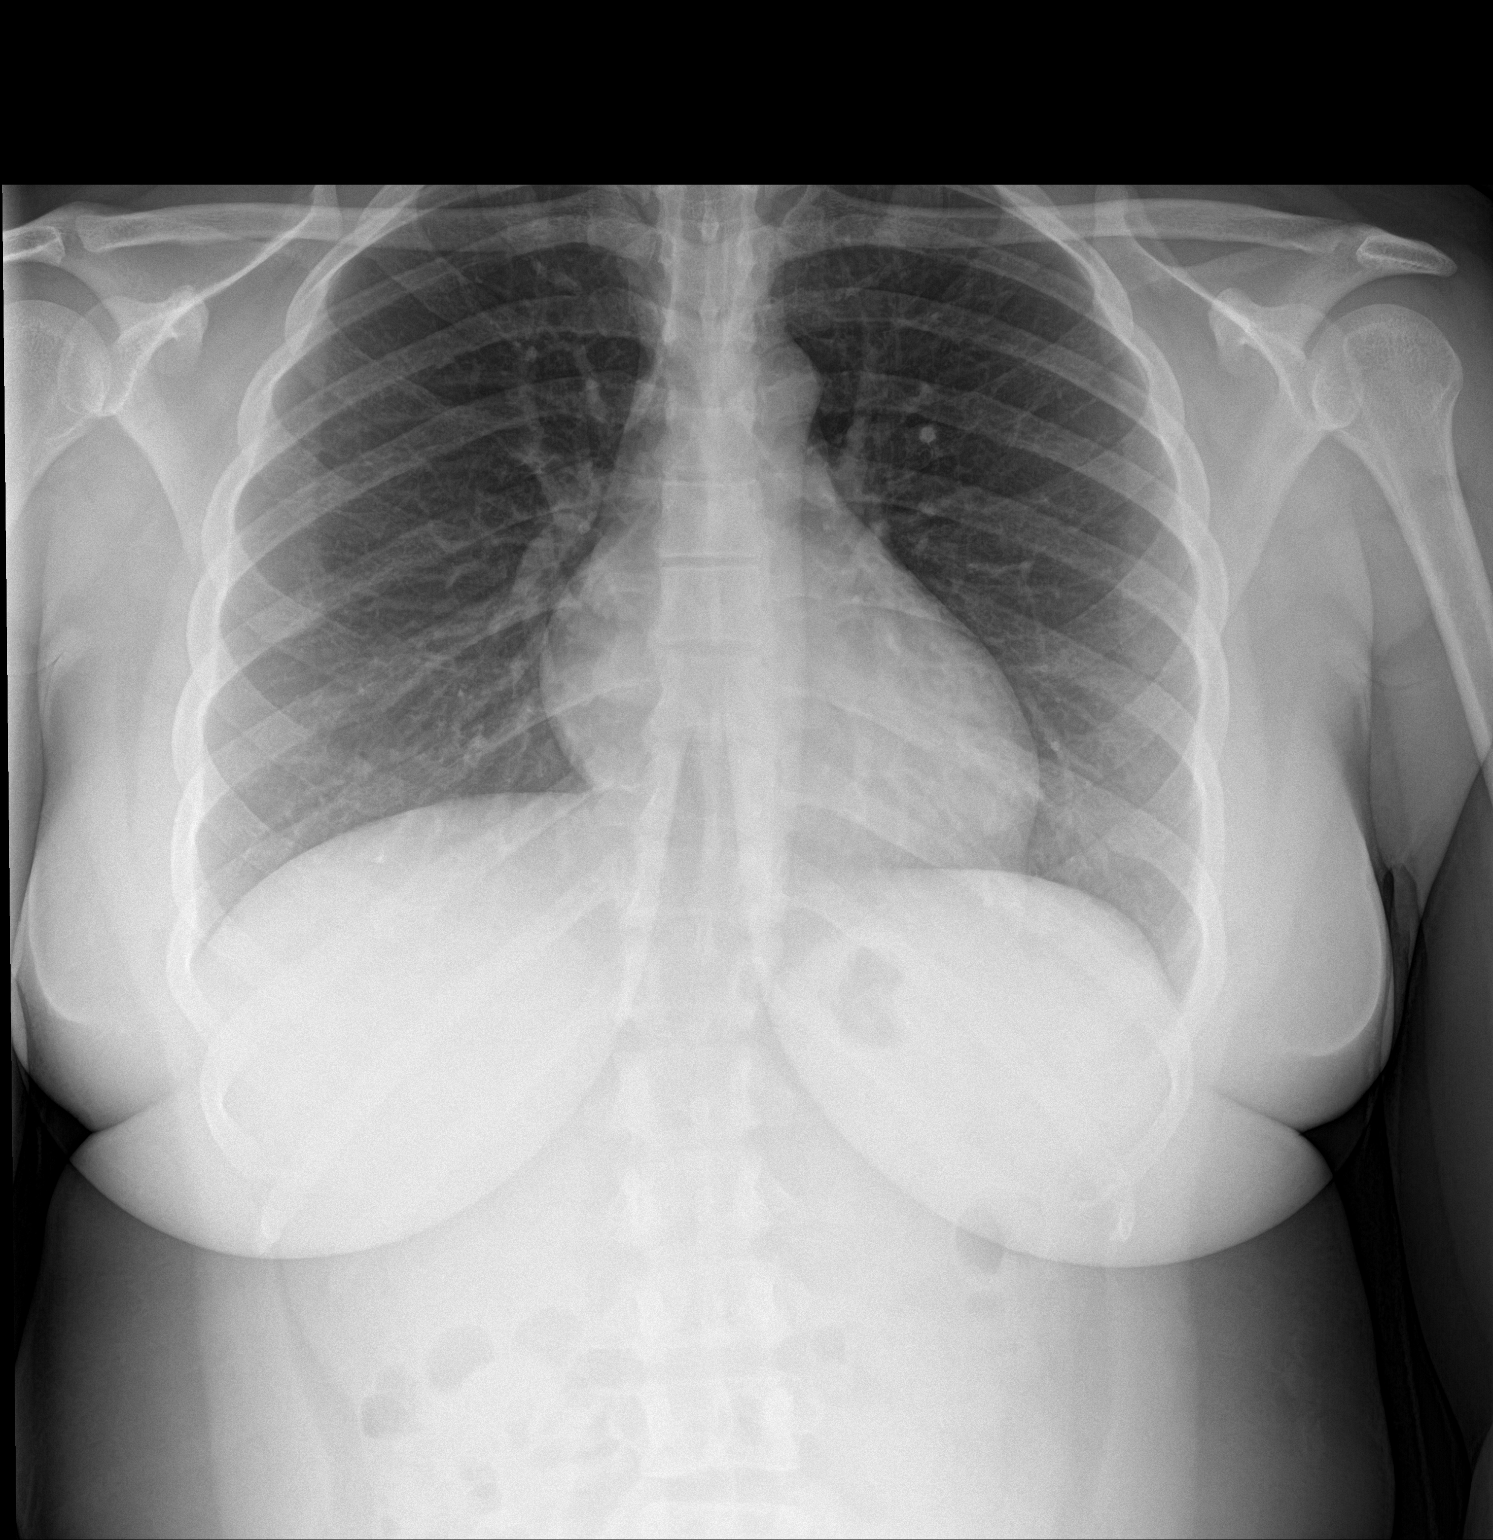

[chest lat]
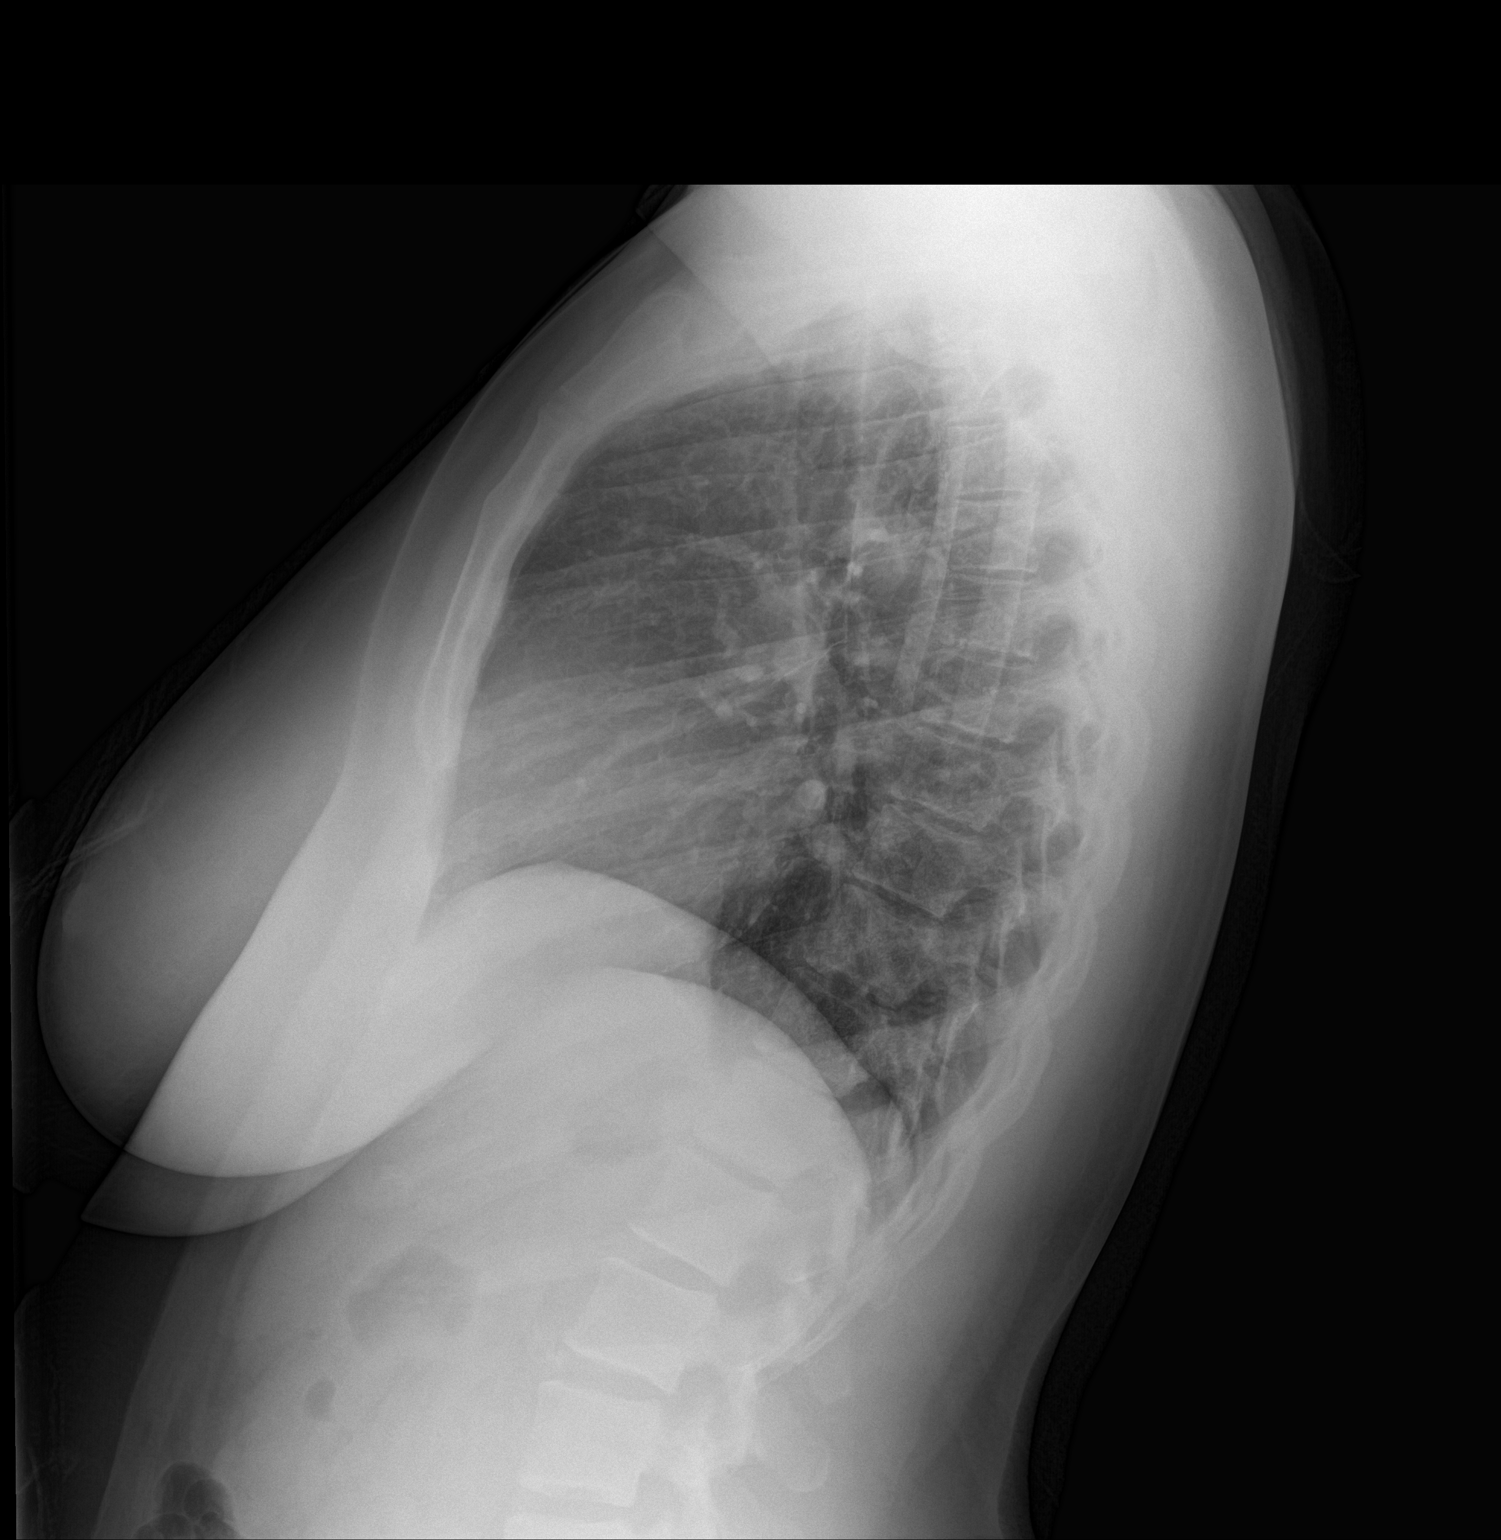

[2 of 2 positions shown; findings below may reference images not displayed]

FINDINGS: The heart size and mediastinal contours are within normal limits.
Both lungs are clear. No pneumothorax or pleural effusion is noted.
The visualized skeletal structures are unremarkable.
IMPRESSION: No active cardiopulmonary disease.

## 2017-05-06 DIAGNOSIS — Z0000 Encounter for general adult medical examination without abnormal findings: Secondary | ICD-10-CM | POA: Diagnosis not present

## 2017-05-15 ENCOUNTER — Encounter: Payer: Self-pay | Admitting: Family

## 2017-05-15 ENCOUNTER — Ambulatory Visit: Payer: 59 | Admitting: Family

## 2017-05-15 VITALS — BP 122/85 | HR 85 | Temp 98.4°F | Resp 16 | Ht 61.0 in | Wt 161.8 lb

## 2017-05-15 DIAGNOSIS — E118 Type 2 diabetes mellitus with unspecified complications: Secondary | ICD-10-CM

## 2017-05-15 LAB — BASIC METABOLIC PANEL
BUN: 8 mg/dL (ref 6–23)
CALCIUM: 9.1 mg/dL (ref 8.4–10.5)
CO2: 27 mEq/L (ref 19–32)
CREATININE: 0.74 mg/dL (ref 0.40–1.20)
Chloride: 104 mEq/L (ref 96–112)
GFR: 111.16 mL/min (ref >60.00)
GLUCOSE: 104 mg/dL — AB (ref 70–99)
POTASSIUM: 3.8 meq/L (ref 3.5–5.1)
Sodium: 139 mEq/L (ref 135–145)

## 2017-05-15 LAB — HEMOGLOBIN A1C: Hgb A1c MFr Bld: 6.1 % (ref 4.6–6.5)

## 2017-05-15 NOTE — Progress Notes (Signed)
Subjective:    Patient ID: Rose Hansen, female    DOB: May 23, 1976, 41 y.o.   MRN: [id number]  HPI   Rose Hansen is a 41 yr old female who presents today for follow up.    DM2- reports that her diet has not been as good recently as it had been.  She is not currently on diabetic medications.  Lab Results  Component Value Date   HGBA1C 5.9 02/13/2017   HGBA1C 6.5 08/15/2016   Lab Results  Component Value Date   MICROALBUR <0.7 02/13/2017   LDLCALC 48 02/13/2017   CREATININE 0.82 02/13/2017       Review of Systems   See HPI   Past Medical History:  Diagnosis Date  . Abnormal glucose   . Circadian rhythm sleep disorder, shift work type   . Diabetes type 2, controlled (HCC) 08/15/2016  . Fatty liver 08/22/2016  . Fibromyalgia   . History of UTI   . Mild anemia   . Nonspecific reaction to tuberculin skin test without active tuberculosis(795.51)      Social History   Socioeconomic History  . Marital status: Single    Spouse name: Not on file  . Number of children: 0  . Years of education: Not on file  . Highest education level: Not on file  Social Needs  . Financial resource strain: Not on file  . Food insecurity - worry: Not on file  . Food insecurity - inability: Not on file  . Transportation needs - medical: Not on file  . Transportation needs - non-medical: Not on file  Occupational History  . Occupation: dialysis nurse at [employer]  Tobacco Use  . Smoking status: Never Smoker  . Smokeless tobacco: Never Used  Substance and Sexual Activity  . Alcohol use: No  . Drug use: No  . Sexual activity: Not on file  Other Topics Concern  . 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 gyn surgery  08/2007   Dr. Jennette KettleNeal for benign  cystic teratomas of right ovary,uterine fibroids, follicular cyst of left ovary    Family History  Problem Relation Age of Onset  . Hypertension Father   . Colon polyps Father   . Alzheimer's disease Mother        end stage dementia  . Asthma Sister   . Colon cancer Maternal Grandfather     Allergies  Allergen Reactions  . Pylera [Bis Subcit-Metronid-Tetracyc] Swelling    Swelling of face, eyes, lips.  . Bismuth-Containing Compounds Swelling  . Metronidazole Swelling  . Tetracyclines & Related Swelling    Current Outpatient Medications on File Prior to Visit  Medication Sig Dispense Refill  . JUNEL FE 1/20 1-20 MG-MCG tablet Take 1 tablet by mouth at bedtime.      Current Facility-Administered Medications on File Prior to Visit  Medication Dose Route Frequency Provider Last Rate Last Dose  . 0.9 %  sodium chloride infusion  500 mL Intravenous Continuous Danis, Starr LakeHenry L III, MD        BP 122/85 (BP Location: Right Arm, Cuff Size: Large)   Pulse 85   Temp 98.4 F (36.9 C) (Oral)   Resp 16   Ht 5\' 1"  (1.549 m)  Wt 161 lb 12.8 oz (73.4 kg)   LMP 12/31/2016   SpO2 100%   BMI 30.57 kg/m       Objective:   Physical Exam  Constitutional: She is oriented to person, place, and time. She appears well-developed and well-nourished.  Cardiovascular: Normal rate, regular rhythm and normal heart sounds.  No murmur heard. Pulmonary/Chest: Effort normal and breath sounds normal. No respiratory distress. She has no wheezes.  Musculoskeletal: She exhibits no edema.  Neurological: She is alert and oriented to person, place, and time.  Psychiatric: She has a normal mood and affect. Her behavior is normal. Judgment and thought content normal.          Assessment & Plan:  DM2- clinically stable. Obtain A1C.  Flu shot and pneumovax up to date.

## 2017-05-15 NOTE — Patient Instructions (Signed)
Please complete lab work prior to leaving. Continue to work on healthy diet, exercise and weight loss.  

## 2017-05-15 NOTE — Addendum Note (Signed)
Addended by: [name]'SULLIVAN, Gerrianne Aydelott on: 05/15/2017 03:35 PM   Modules accepted: Level of Service

## 2017-07-17 LAB — HM DIABETES EYE EXAM

## 2017-07-24 ENCOUNTER — Encounter: Payer: Self-pay | Admitting: Family

## 2017-09-10 NOTE — Patient Instructions (Addendum)
Please complete lab work prior to leaving. Stop in radiology on the first floor and schedule your mammogram.

## 2017-09-10 NOTE — Progress Notes (Signed)
Subjective:    Patient ID: Rose Hansen, female    DOB: Apr 29, 1976, 42 y.o.   MRN: [id number]  HPI Patient is a 42 year old female who presents today for routine follow-up.  DM2-diabetes is diet controlled. Reports that she is doing "so so with her diet." wants to lose another 10 pounds.   Lab Results  Component Value Date   HGBA1C 6.1 05/15/2017   HGBA1C 5.9 02/13/2017   HGBA1C 6.5 08/15/2016   Lab Results  Component Value Date   MICROALBUR <0.7 02/13/2017   LDLCALC 48 02/13/2017   CREATININE 0.74 05/15/2017    History of fatty liver-  Lab Results  Component Value Date   CHOL 113 02/13/2017   HDL 52.00 02/13/2017   LDLCALC 48 02/13/2017   TRIG 65.0 02/13/2017   CHOLHDL 2 02/13/2017    Lab Results  Component Value Date   ALT 37 (H) 08/13/2016   AST 41 (H) 08/13/2016   ALKPHOS 50 08/13/2016   BILITOT 0.5 08/13/2016     Review of Systems See HPI  Past Medical History:  Diagnosis Date  . Abnormal glucose   . Circadian rhythm sleep disorder, shift work type   . Diabetes type 2, controlled (HCC) 08/15/2016  . Fatty liver 08/22/2016  . Fibromyalgia   . History of UTI   . Mild anemia   . Nonspecific reaction to tuberculin skin test without active tuberculosis(795.51)      Social History   Socioeconomic History  . Marital status: Single    Spouse name: Not on file  . Number of children: 0  . Years of education: Not on file  . Highest education level: Not on file  Occupational History  . Occupation: dialysis nurse at [employer]  . Financial resource strain: Not on file  . Food insecurity:    Worry: Not on file    Inability: Not on file  . Transportation needs:    Medical: Not on file    Non-medical: Not on file  Tobacco Use  . Smoking status: Never Smoker  . Smokeless tobacco: Never Used  Substance and Sexual Activity  . Alcohol use: No  . Drug use: No  . Sexual activity: Not on file  Lifestyle  . Physical activity:   Days per week: Not on file    Minutes per session: Not on file  . Stress: Not on file  Relationships  . Social connections:    Talks on phone: Not on file    Gets together: Not on file    Attends religious service: Not on file    Active member of club or organization: Not on file    Attends meetings of clubs or organizations: Not on file    Relationship status: Not on file  . Intimate partner violence:    Fear of current or ex partner: Not on file    Emotionally abused: Not on file    Physically abused: Not on file    Forced sexual activity: Not on file  Other Topics Concern  . 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 gyn surgery  08/2007   Dr. Jennette Kettle for benign cystic teratomas of right ovary,uterine fibroids, follicular cyst of left ovary  Family History  Problem Relation Age of Onset  . Hypertension Father   . Colon polyps Father   . Alzheimer's disease Mother        end stage dementia  . Asthma Sister   . Colon cancer Maternal Grandfather     Allergies  Allergen Reactions  . Pylera [Bis Subcit-Metronid-Tetracyc] Swelling    Swelling of face, eyes, lips.  . Bismuth-Containing Compounds Swelling  . Metronidazole Swelling  . Tetracyclines & Related Swelling    Current Outpatient Medications on File Prior to Visit  Medication Sig Dispense Refill  . JUNEL FE 1/20 1-20 MG-MCG tablet Take 1 tablet by mouth at bedtime.      Current Facility-Administered Medications on File Prior to Visit  Medication Dose Route Frequency Provider Last Rate Last Dose  . 0.9 %  sodium chloride infusion  500 mL Intravenous Continuous Danis, Starr LakeHenry L III, MD        BP 126/86 (BP Location: Right Arm, Cuff Size: Large)   Pulse 84   Temp 98.7 F (37.1 C) (Oral)    Resp 16   Ht 5\' 1"  (1.549 m)   Wt 161 lb 3.2 oz (73.1 kg)   SpO2 100%   BMI 30.46 kg/m       Objective:   Physical Exam  Constitutional: She is oriented to person, place, and time. She appears well-developed and well-nourished.  HENT:  Head: Normocephalic and atraumatic.  Cardiovascular: Normal rate, regular rhythm and normal heart sounds.  No murmur heard. Pulmonary/Chest: Effort normal and breath sounds normal. No respiratory distress. She has no wheezes.  Musculoskeletal: She exhibits no edema.  Neurological: She is alert and oriented to person, place, and time.  Psychiatric: She has a normal mood and affect. Her behavior is normal. Judgment and thought content normal.          Assessment & Plan:  Diabetes type 2- discussed healthy diet, exercise, weight loss. Obtain follow up A1C.  Fatty liver- dietary changes discussed.  Obtain follow up LFT and lipid panel.  Hx of PPD +- completed INH therapy in the past. Requests chest x ray for her employer. Will order.

## 2017-09-11 ENCOUNTER — Ambulatory Visit (HOSPITAL_BASED_OUTPATIENT_CLINIC_OR_DEPARTMENT_OTHER)
Admission: RE | Admit: 2017-09-11 | Discharge: 2017-09-11 | Disposition: A | Discharge status: Home / Self Care | Payer: 59 | Source: Ambulatory Visit | Attending: Family | Admitting: Family

## 2017-09-11 ENCOUNTER — Encounter: Payer: Self-pay | Admitting: Family

## 2017-09-11 ENCOUNTER — Ambulatory Visit: Payer: 59 | Admitting: Family

## 2017-09-11 VITALS — BP 126/86 | HR 84 | Temp 98.7°F | Resp 16 | Ht 61.0 in | Wt 161.2 lb

## 2017-09-11 DIAGNOSIS — K760 Fatty (change of) liver, not elsewhere classified: Secondary | ICD-10-CM | POA: Diagnosis not present

## 2017-09-11 DIAGNOSIS — R7611 Nonspecific reaction to tuberculin skin test without active tuberculosis: Secondary | ICD-10-CM | POA: Diagnosis not present

## 2017-09-11 DIAGNOSIS — Z9289 Personal history of other medical treatment: Secondary | ICD-10-CM

## 2017-09-11 DIAGNOSIS — E119 Type 2 diabetes mellitus without complications: Secondary | ICD-10-CM

## 2017-09-11 DIAGNOSIS — Z0000 Encounter for general adult medical examination without abnormal findings: Secondary | ICD-10-CM

## 2017-09-11 LAB — COMPREHENSIVE METABOLIC PANEL
ALBUMIN: 4.2 g/dL (ref 3.5–5.2)
ALT: 8 U/L (ref 0–35)
AST: 12 U/L (ref 0–37)
Alkaline Phosphatase: 56 U/L (ref 39–117)
BUN: 8 mg/dL (ref 6–23)
CHLORIDE: 103 meq/L (ref 96–112)
CO2: 28 mEq/L (ref 19–32)
Calcium: 9 mg/dL (ref 8.4–10.5)
Creatinine, Ser: 0.81 mg/dL (ref 0.40–1.20)
GFR: 99.99 mL/min (ref >60.00)
GLUCOSE: 98 mg/dL (ref 70–99)
POTASSIUM: 4.2 meq/L (ref 3.5–5.1)
SODIUM: 138 meq/L (ref 135–145)
Total Bilirubin: 0.5 mg/dL (ref 0.2–1.2)
Total Protein: 7.2 g/dL (ref 6.0–8.3)

## 2017-09-11 LAB — LIPID PANEL
Cholesterol: 100 mg/dL (ref 0–200)
HDL: 44.4 mg/dL (ref >39.00)
LDL CALC: 44 mg/dL (ref 0–99)
NonHDL: 55.52
Total CHOL/HDL Ratio: 2
Triglycerides: 57 mg/dL (ref 0.0–149.0)
VLDL: 11.4 mg/dL (ref 0.0–40.0)

## 2017-09-11 LAB — HEMOGLOBIN A1C: HEMOGLOBIN A1C: 6 % (ref 4.6–6.5)

## 2017-09-15 ENCOUNTER — Ambulatory Visit (HOSPITAL_BASED_OUTPATIENT_CLINIC_OR_DEPARTMENT_OTHER)
Admission: RE | Admit: 2017-09-15 | Discharge: 2017-09-15 | Disposition: A | Discharge status: Home / Self Care | Payer: 59 | Source: Ambulatory Visit | Attending: Family | Admitting: Family

## 2017-09-15 DIAGNOSIS — Z1231 Encounter for screening mammogram for malignant neoplasm of breast: Secondary | ICD-10-CM | POA: Diagnosis present

## 2017-09-15 DIAGNOSIS — Z0000 Encounter for general adult medical examination without abnormal findings: Secondary | ICD-10-CM

## 2017-09-19 ENCOUNTER — Ambulatory Visit (HOSPITAL_BASED_OUTPATIENT_CLINIC_OR_DEPARTMENT_OTHER): Payer: 59

## 2017-10-24 DIAGNOSIS — J039 Acute tonsillitis, unspecified: Secondary | ICD-10-CM | POA: Diagnosis not present

## 2017-12-16 ENCOUNTER — Encounter: Payer: Self-pay | Admitting: Family

## 2017-12-16 ENCOUNTER — Ambulatory Visit (INDEPENDENT_AMBULATORY_CARE_PROVIDER_SITE_OTHER): Payer: 59 | Admitting: Family

## 2017-12-16 VITALS — BP 123/75 | HR 76 | Temp 98.5°F | Resp 16 | Ht 60.0 in | Wt 159.8 lb

## 2017-12-16 DIAGNOSIS — Z800 Family history of malignant neoplasm of digestive organs: Secondary | ICD-10-CM

## 2017-12-16 DIAGNOSIS — Z0000 Encounter for general adult medical examination without abnormal findings: Secondary | ICD-10-CM

## 2017-12-16 DIAGNOSIS — E119 Type 2 diabetes mellitus without complications: Secondary | ICD-10-CM

## 2017-12-16 LAB — CBC WITH DIFFERENTIAL/PLATELET
BASOS PCT: 0.7 % (ref 0.0–3.0)
Basophils Absolute: 0.1 10*3/uL (ref 0.0–0.1)
EOS PCT: 3.3 % (ref 0.0–5.0)
Eosinophils Absolute: 0.3 10*3/uL (ref 0.0–0.7)
HCT: 36.9 % (ref 36.0–46.0)
HEMOGLOBIN: 12.3 g/dL (ref 12.0–15.0)
Lymphocytes Relative: 42.6 % (ref 12.0–46.0)
Lymphs Abs: 3.4 10*3/uL (ref 0.7–4.0)
MCHC: 33.3 g/dL (ref 30.0–36.0)
MCV: 87.9 fl (ref 78.0–100.0)
MONO ABS: 0.5 10*3/uL (ref 0.1–1.0)
Monocytes Relative: 6.2 % (ref 3.0–12.0)
Neutro Abs: 3.8 10*3/uL (ref 1.4–7.7)
Neutrophils Relative %: 47.2 % (ref 43.0–77.0)
Platelets: 336 10*3/uL (ref 150.0–400.0)
RBC: 4.19 Mil/uL (ref 3.87–5.11)
RDW: 13.7 % (ref 11.5–15.5)
WBC: 8.1 10*3/uL (ref 4.0–10.5)

## 2017-12-16 LAB — LIPID PANEL
CHOLESTEROL: 104 mg/dL (ref 0–200)
HDL: 45.8 mg/dL (ref >39.00)
LDL CALC: 44 mg/dL (ref 0–99)
NonHDL: 58.56
TRIGLYCERIDES: 74 mg/dL (ref 0.0–149.0)
Total CHOL/HDL Ratio: 2
VLDL: 14.8 mg/dL (ref 0.0–40.0)

## 2017-12-16 LAB — BASIC METABOLIC PANEL
BUN: 10 mg/dL (ref 6–23)
CHLORIDE: 105 meq/L (ref 96–112)
CO2: 27 meq/L (ref 19–32)
Calcium: 9.2 mg/dL (ref 8.4–10.5)
Creatinine, Ser: 0.84 mg/dL (ref 0.40–1.20)
GFR: 95.76 mL/min (ref >60.00)
Glucose, Bld: 115 mg/dL — ABNORMAL HIGH (ref 70–99)
POTASSIUM: 4.3 meq/L (ref 3.5–5.1)
Sodium: 139 mEq/L (ref 135–145)

## 2017-12-16 LAB — URINALYSIS, ROUTINE W REFLEX MICROSCOPIC
BILIRUBIN URINE: NEGATIVE
KETONES UR: NEGATIVE
LEUKOCYTES UA: NEGATIVE
NITRITE: NEGATIVE
PH: 6.5 (ref 5.0–8.0)
Specific Gravity, Urine: <=1.005 — AB (ref 1.000–1.030)
TOTAL PROTEIN, URINE-UPE24: NEGATIVE
Urine Glucose: NEGATIVE
Urobilinogen, UA: 0.2 (ref 0.0–1.0)

## 2017-12-16 LAB — HEPATIC FUNCTION PANEL
ALT: 10 U/L (ref 0–35)
AST: 12 U/L (ref 0–37)
Albumin: 4.2 g/dL (ref 3.5–5.2)
Alkaline Phosphatase: 56 U/L (ref 39–117)
BILIRUBIN TOTAL: 0.4 mg/dL (ref 0.2–1.2)
Bilirubin, Direct: 0.1 mg/dL (ref 0.0–0.3)
Total Protein: 6.9 g/dL (ref 6.0–8.3)

## 2017-12-16 LAB — TSH: TSH: 1.18 u[IU]/mL (ref 0.35–4.50)

## 2017-12-16 LAB — HEMOGLOBIN A1C: Hgb A1c MFr Bld: 6.1 % (ref 4.6–6.5)

## 2017-12-16 NOTE — Progress Notes (Signed)
Subjective:    Patient ID: Rose Hansen, female    DOB: 1975-07-07, 42 y.o.   MRN: [id number]  HPI  Patient presents today for complete physical.  Immunizations: td 9/10 Diet: diet is fair  Wt Readings from Last 3 Encounters:  12/16/17 159 lb 12.8 oz (72.5 kg)  09/11/17 161 lb 3.2 oz (73.1 kg)  05/15/17 161 lb 12.8 oz (73.4 kg)   Breakfast English muffin (butter/sour cream), halo fruit, cereal bar Lunch-  Sometimes eats out (pizza or chinese) occasionally a salad Dinner- grilled chicken, sometimes veggies, spaghetti Evening- peanuts, halos  Drinks water and lemonade.   Exercise: goes 3 times a week walks 30-45 minutes Pap Smear: 01/09/15 Mammogram: 09/15/17 Vision: 2/19 Dental: scheduled   DM2-  Lab Results  Component Value Date   HGBA1C 6.0 09/11/2017   HGBA1C 6.1 05/15/2017   HGBA1C 5.9 02/13/2017   Lab Results  Component Value Date   MICROALBUR <0.7 02/13/2017   LDLCALC 44 09/11/2017   CREATININE 0.81 09/11/2017     Review of Systems  Constitutional: Negative for unexpected weight change.  HENT: Negative for hearing loss and rhinorrhea.   Eyes: Negative for visual disturbance.  Respiratory: Negative for cough.   Cardiovascular: Negative for leg swelling.  Gastrointestinal: Negative for blood in stool, constipation and diarrhea.  Genitourinary: Negative for dysuria, frequency and hematuria.  Musculoskeletal: Negative for arthralgias and myalgias.  Skin: Negative for rash.  Neurological: Negative for headaches.  Hematological: Negative for adenopathy.  Psychiatric/Behavioral:       Denies depression/anxiety   Past Medical History:  Diagnosis Date  . Abnormal glucose   . Circadian rhythm sleep disorder, shift work type   . Diabetes type 2, controlled (HCC) 08/15/2016  . Fatty liver 08/22/2016  . Fibromyalgia   . History of UTI   . Mild anemia   . Nonspecific reaction to tuberculin skin test without active tuberculosis(795.51)      Social  History   Socioeconomic History  . Marital status: Single    Spouse name: Not on file  . Number of children: 0  . Years of education: Not on file  . Highest education level: Not on file  Occupational History  . Occupation: dialysis nurse at [employer]  . Financial resource strain: Not on file  . Food insecurity:    Worry: Not on file    Inability: Not on file  . Transportation needs:    Medical: Not on file    Non-medical: Not on file  Tobacco Use  . Smoking status: Never Smoker  . Smokeless tobacco: Never Used  Substance and Sexual Activity  . Alcohol use: No  . Drug use: No  . Sexual activity: Not on file  Lifestyle  . Physical activity:    Days per week: Not on file    Minutes per session: Not on file  . Stress: Not on file  Relationships  . Social connections:    Talks on phone: Not on file    Gets together: Not on file    Attends religious service: Not on file    Active member of club or organization: Not on file    Attends meetings of clubs or organizations: Not on file    Relationship status: Not on file  . Intimate partner violence:    Fear of current or ex partner: Not on file    Emotionally abused: Not on file    Physically abused: Not on file    Forced  sexual activity: Not on file  Other Topics Concern  . 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 gyn surgery  08/2007   Dr. Jennette KettleNeal for benign cystic teratomas of right ovary,uterine fibroids, follicular cyst of left ovary    Family History  Problem Relation Age of Onset  . Hypertension Father   . Colon polyps Father   . Alzheimer's disease Mother 1664       end stage dementia  . Asthma Sister   . Colon cancer Maternal Grandfather 45    Allergies  Allergen  Reactions  . Pylera [Bis Subcit-Metronid-Tetracyc] Swelling    Swelling of face, eyes, lips.  . Bismuth-Containing Compounds Swelling  . Metronidazole Swelling  . Tetracyclines & Related Swelling    Current Outpatient Medications on File Prior to Visit  Medication Sig Dispense Refill  . JUNEL FE 1/20 1-20 MG-MCG tablet Take 1 tablet by mouth at bedtime.      Current Facility-Administered Medications on File Prior to Visit  Medication Dose Route Frequency Provider Last Rate Last Dose  . 0.9 %  sodium chloride infusion  500 mL Intravenous Continuous Danis, Starr LakeHenry L III, MD        BP 123/75 (BP Location: Right Arm, Cuff Size: Large)   Pulse 76   Temp 98.5 F (36.9 C) (Oral)   Resp 16   Ht 5' (1.524 m)   Wt 159 lb 12.8 oz (72.5 kg)   SpO2 100%   BMI 31.21 kg/m       Objective:   Physical Exam  Physical Exam  Constitutional: She is oriented to person, place, and time. She appears well-developed and well-nourished. No distress.  HENT:  Head: Normocephalic and atraumatic.  Right Ear: Tympanic membrane and ear canal normal.  Left Ear: Tympanic membrane and ear canal normal.  Mouth/Throat: Oropharynx is clear and moist.  Eyes: Pupils are equal, round, and reactive to light. No scleral icterus.  Neck: Normal range of motion. No thyromegaly present.  Cardiovascular: Normal rate and regular rhythm.   No murmur heard. Pulmonary/Chest: Effort normal and breath sounds normal. No respiratory distress. He has no wheezes. She has no rales. She exhibits no tenderness.  Abdominal: Soft. Bowel sounds are normal. She exhibits no distension and no mass. There is no tenderness. There is no rebound and no guarding.  Musculoskeletal: She exhibits no edema.  Lymphadenopathy:    She has no cervical adenopathy.  Neurological: She is alert and oriented to person, place, and time. She has normal patellar reflexes. She exhibits normal muscle tone. Coordination normal.  Skin: Skin is warm and dry.    Psychiatric: She has a normal mood and affect. Her behavior is normal. Judgment and thought content normal.  Breasts: Examined lying Right: Without masses, retractions, discharge or axillary adenopathy.  Left: Without masses, retractions, discharge or axillary adenopathy.  Pelvic: deferred to GYN           Assessment & Plan:     Preventative care- discussed healthy diet, exercise, weight loss. Will have pap performed by GYN.  Obtain routine lab work.  Immunizations reviewed and up to date.   DM2- check follow up A1C. Sugar is controlled. Discussed dietary changes/diabetic diet.     Assessment &  Plan:  EKG tracing is personally reviewed.  EKG notes NSR.  No acute changes.

## 2017-12-16 NOTE — Patient Instructions (Signed)
Please complete lab work prior to leaving. Continue to work on healthy diet, exercise and weight loss.  

## 2017-12-21 LAB — HM PAP SMEAR: HM Pap smear: NEGATIVE

## 2018-01-15 ENCOUNTER — Ambulatory Visit: Payer: 59 | Admitting: Gastroenterology

## 2018-01-15 ENCOUNTER — Encounter: Payer: Self-pay | Admitting: Gastroenterology

## 2018-01-15 VITALS — BP 128/70 | HR 72 | Ht 60.0 in | Wt 157.2 lb

## 2018-01-15 DIAGNOSIS — Z800 Family history of malignant neoplasm of digestive organs: Secondary | ICD-10-CM

## 2018-01-15 DIAGNOSIS — Z8619 Personal history of other infectious and parasitic diseases: Secondary | ICD-10-CM | POA: Diagnosis not present

## 2018-01-15 NOTE — Patient Instructions (Signed)
If you are age 42 or older, your body mass index should be between 23-30. Your Body mass index is 30.71 kg/m. If this is out of the aforementioned range listed, please consider follow up with your Primary Care Provider.  If you are age 42 or younger, your body mass index should be between 19-25. Your Body mass index is 30.71 kg/m. If this is out of the aformentioned range listed, please consider follow up with your Primary Care Provider.   Colonoscopy screening due at age 42.   It was a pleasure to see you today!  Dr. Myrtie Neitheranis

## 2018-01-15 NOTE — Progress Notes (Signed)
     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 pylori diagnosis.  She received antibiotic therapy and PPI.  Upper endoscopy January 2018 was a normal study, and gastric biopsies confirm eradication of H. pylori.  She is felt well since then, reporting infrequent heartburn, resolution of bloating and upper abdominal pain, normal appetite and stable weight.  She was referred back by primary care because a family history of colon cancer in her maternal grandfather in his early 2740s. Vilda reports no additional family members with colorectal cancer, especially parents or siblings.  She denies change in bowel habits or rectal bleeding.  ROS: Cardiovascular:  no chest pain Respiratory: no dyspnea  The patient's Past Medical, Family and Social History were reviewed and are on file in the EMR.  Objective:  Med list reviewed  Current Outpatient Medications:  .  JUNEL FE 1/20 1-20 MG-MCG tablet, Take 1 tablet by mouth at bedtime. , Disp: , Rfl:   Current Facility-Administered Medications:  .  0.9 %  sodium chloride infusion, 500 mL, Intravenous, Continuous, Danis, Starr LakeHenry L III, MD   Vital signs in last 24 hrs: Vitals:   01/15/18 1118  BP: 128/70  Pulse: 72    Physical Exam  She is well-appearing  Cardiac: RRR without murmurs, S1S2 heard, no peripheral edema  Pulm: clear to auscultation bilaterally, normal RR and effort noted  Abdomen: soft, no tenderness, with active bowel sounds. No guarding or palpable hepatosplenomegaly.    @ASSESSMENTPLANBEGIN @ Assessment: Encounter Diagnoses  Name Primary?  . Family history of malignant neoplasm of gastrointestinal tract Yes  . History of Helicobacter pylori infection     Her family history of a single second-degree relative with  colorectal cancer, even at a relatively early age, still puts her at average risk for colorectal cancer.  Following the multisociety task force recommendations to start screening for African-Americans at age 42, I asked her to see us when that time arrives.  She will otherwise see me as needed.   Charlie PitterHenry L Danis III

## 2018-01-20 ENCOUNTER — Telehealth: Payer: Self-pay | Admitting: *Deleted

## 2018-01-20 NOTE — Telephone Encounter (Signed)
Attempted to reach pt at below # and received messages that she is not receiving messages at this time. Mychart message sent to pt. Copy place at the front desk for pick up.

## 2018-01-20 NOTE — Telephone Encounter (Signed)
Copied from CRM [phone]. Topic: Quick Communication - See Telephone Encounter >> Jan 20, 2018  3:16 PM Waymon AmatoBurton, Donna F wrote: Pt is needing a copy of her mammogram report to take with her to her gyn appt on 01/21/18 at 930 please call when ready to pick up   Best number [phone]

## 2018-01-21 DIAGNOSIS — Z01419 Encounter for gynecological examination (general) (routine) without abnormal findings: Secondary | ICD-10-CM | POA: Diagnosis not present

## 2018-03-19 ENCOUNTER — Encounter: Payer: Self-pay | Admitting: Family

## 2018-03-19 ENCOUNTER — Ambulatory Visit: Payer: 59 | Admitting: Family

## 2018-03-19 VITALS — BP 124/94 | HR 103 | Temp 98.2°F | Resp 16 | Ht 60.0 in | Wt 165.0 lb

## 2018-03-19 DIAGNOSIS — Z23 Encounter for immunization: Secondary | ICD-10-CM | POA: Diagnosis not present

## 2018-03-19 DIAGNOSIS — E119 Type 2 diabetes mellitus without complications: Secondary | ICD-10-CM

## 2018-03-19 DIAGNOSIS — R030 Elevated blood-pressure reading, without diagnosis of hypertension: Secondary | ICD-10-CM | POA: Diagnosis not present

## 2018-03-19 DIAGNOSIS — F4321 Adjustment disorder with depressed mood: Secondary | ICD-10-CM

## 2018-03-19 LAB — BASIC METABOLIC PANEL
BUN: 12 mg/dL (ref 6–23)
CHLORIDE: 104 meq/L (ref 96–112)
CO2: 25 mEq/L (ref 19–32)
Calcium: 9.5 mg/dL (ref 8.4–10.5)
Creatinine, Ser: 0.83 mg/dL (ref 0.40–1.20)
GFR: 96.97 mL/min (ref >60.00)
Glucose, Bld: 118 mg/dL — ABNORMAL HIGH (ref 70–99)
POTASSIUM: 4.2 meq/L (ref 3.5–5.1)
Sodium: 138 mEq/L (ref 135–145)

## 2018-03-19 LAB — HEMOGLOBIN A1C: HEMOGLOBIN A1C: 6.2 % (ref 4.6–6.5)

## 2018-03-19 LAB — MICROALBUMIN / CREATININE URINE RATIO
Creatinine,U: 69 mg/dL
MICROALB/CREAT RATIO: 1 mg/g (ref 0.0–30.0)
Microalb, Ur: <0.7 mg/dL (ref 0.0–1.9)

## 2018-03-19 NOTE — Patient Instructions (Signed)
Please work on [employer], exercise, weight loss. Try listening to "Weight Loss for Busy Physicians" by Virl Cagey MD. Complete lab work prior to leaving.

## 2018-03-19 NOTE — Progress Notes (Signed)
Subjective:    Patient ID: Rose Hansen, female    DOB: Dec 24, 1975, 42 y.o.   MRN: [id number]  HPI  Patient is a 42 yr old female who presents today for follow up.  DM2- this is diet controlled. Mom diet in August so she has been emotionally eating.     Wt Readings from Last 3 Encounters:  03/19/18 165 lb (74.8 kg)  01/15/18 157 lb 4 oz (71.3 kg)  12/16/17 159 lb 12.8 oz (72.5 kg)    Lab Results  Component Value Date   HGBA1C 6.1 12/16/2017   HGBA1C 6.0 09/11/2017   HGBA1C 6.1 05/15/2017   Lab Results  Component Value Date   MICROALBUR <0.7 02/13/2017   LDLCALC 44 12/16/2017   CREATININE 0.84 12/16/2017    Mother had Alzheimers an stopped eating/drinking in the end of July.  Pt continues to grieve her loss.      Review of Systems See HPI  Past Medical History:  Diagnosis Date  . Abnormal glucose   . Circadian rhythm sleep disorder, shift work type   . Diabetes type 2, controlled (HCC) 08/15/2016  . Fatty liver 08/22/2016  . Fibromyalgia   . History of UTI   . Mild anemia   . Nonspecific reaction to tuberculin skin test without active tuberculosis(795.51)      Social History   Socioeconomic History  . Marital status: Single    Spouse name: Not on file  . Number of children: 0  . Years of education: Not on file  . Highest education level: Not on file  Occupational History  . Occupation: dialysis nurse at [employer]  . Financial resource strain: Not on file  . Food insecurity:    Worry: Not on file    Inability: Not on file  . Transportation needs:    Medical: Not on file    Non-medical: Not on file  Tobacco Use  . Smoking status: Never Smoker  . Smokeless tobacco: Never Used  Substance and Sexual Activity  . Alcohol use: No  . Drug use: No  . Sexual activity: Not on file  Lifestyle  . Physical activity:    Days per week: Not on file    Minutes per session: Not on file  . Stress: Not on file  Relationships  . Social  connections:    Talks on phone: Not on file    Gets together: Not on file    Attends religious service: Not on file    Active member of club or organization: Not on file    Attends meetings of clubs or organizations: Not on file    Relationship status: Not on file  . Intimate partner violence:    Fear of current or ex partner: Not on file    Emotionally abused: Not on file    Physically abused: Not on file    Forced sexual activity: Not on file  Other Topics Concern  . 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 gyn surgery  08/2007   Dr. Jennette Kettle for benign cystic teratomas of right ovary,uterine fibroids, follicular cyst of left ovary    Family History  Problem Relation Age of Onset  . Hypertension Father   .  Colon polyps Father   . Alzheimer's disease Mother 7       end stage dementia  . Asthma Sister   . Colon cancer Maternal Grandfather 45    Allergies  Allergen Reactions  . Pylera [Bis Subcit-Metronid-Tetracyc] Swelling    Swelling of face, eyes, lips.  . Bismuth-Containing Compounds Swelling  . Metronidazole Swelling  . Tetracyclines & Related Swelling    Current Outpatient Medications on File Prior to Visit  Medication Sig Dispense Refill  . JUNEL FE 1/20 1-20 MG-MCG tablet Take 1 tablet by mouth at bedtime.      Current Facility-Administered Medications on File Prior to Visit  Medication Dose Route Frequency Provider Last Rate Last Dose  . 0.9 %  sodium chloride infusion  500 mL Intravenous Continuous Danis, Starr Lake III, MD        BP (!) 124/94 (BP Location: Right Arm, Patient Position: Sitting, Cuff Size: Small)   Pulse (!) 103   Temp 98.2 F (36.8 C) (Oral)   Resp 16   Ht 5' (1.524 m)   Wt 165 lb (74.8 kg)   SpO2 100%   BMI  32.22 kg/m       Objective:   Physical Exam  Constitutional: She appears well-developed and well-nourished.  Cardiovascular: Normal rate, regular rhythm and normal heart sounds.  No murmur heard. Pulmonary/Chest: Effort normal and breath sounds normal. No respiratory distress. She has no wheezes.  Psychiatric: She has a normal mood and affect. Her behavior is normal. Judgment and thought content normal.          Assessment & Plan:  DM2- discussed healthy diet, exercise, weight loss.  Obtain follow up lab work. Flu shot today.    Elevated blood pressure-  BP Readings from Last 3 Encounters:  03/19/18 (!) 124/94  01/15/18 128/70  12/16/17 123/75   DBP elevated today.  Has been historically much lower.  Will monitor. If still elevated next visit plan to add antihypertensive.    Grief reaction- support provided. Appears to be grieving appropriately.  Denies depression/anxiety.  Monitor.

## 2018-03-27 IMAGING — DX DG CHEST 2V
2 series · 2 of 2 positions shown · non-contrast
Comparison: 06/23/2016.

CLINICAL DATA: Positive PPD test and pt has been
treated,recheck,nonsmoker

EXAM:
CHEST  2 VIEW

[chest pa]
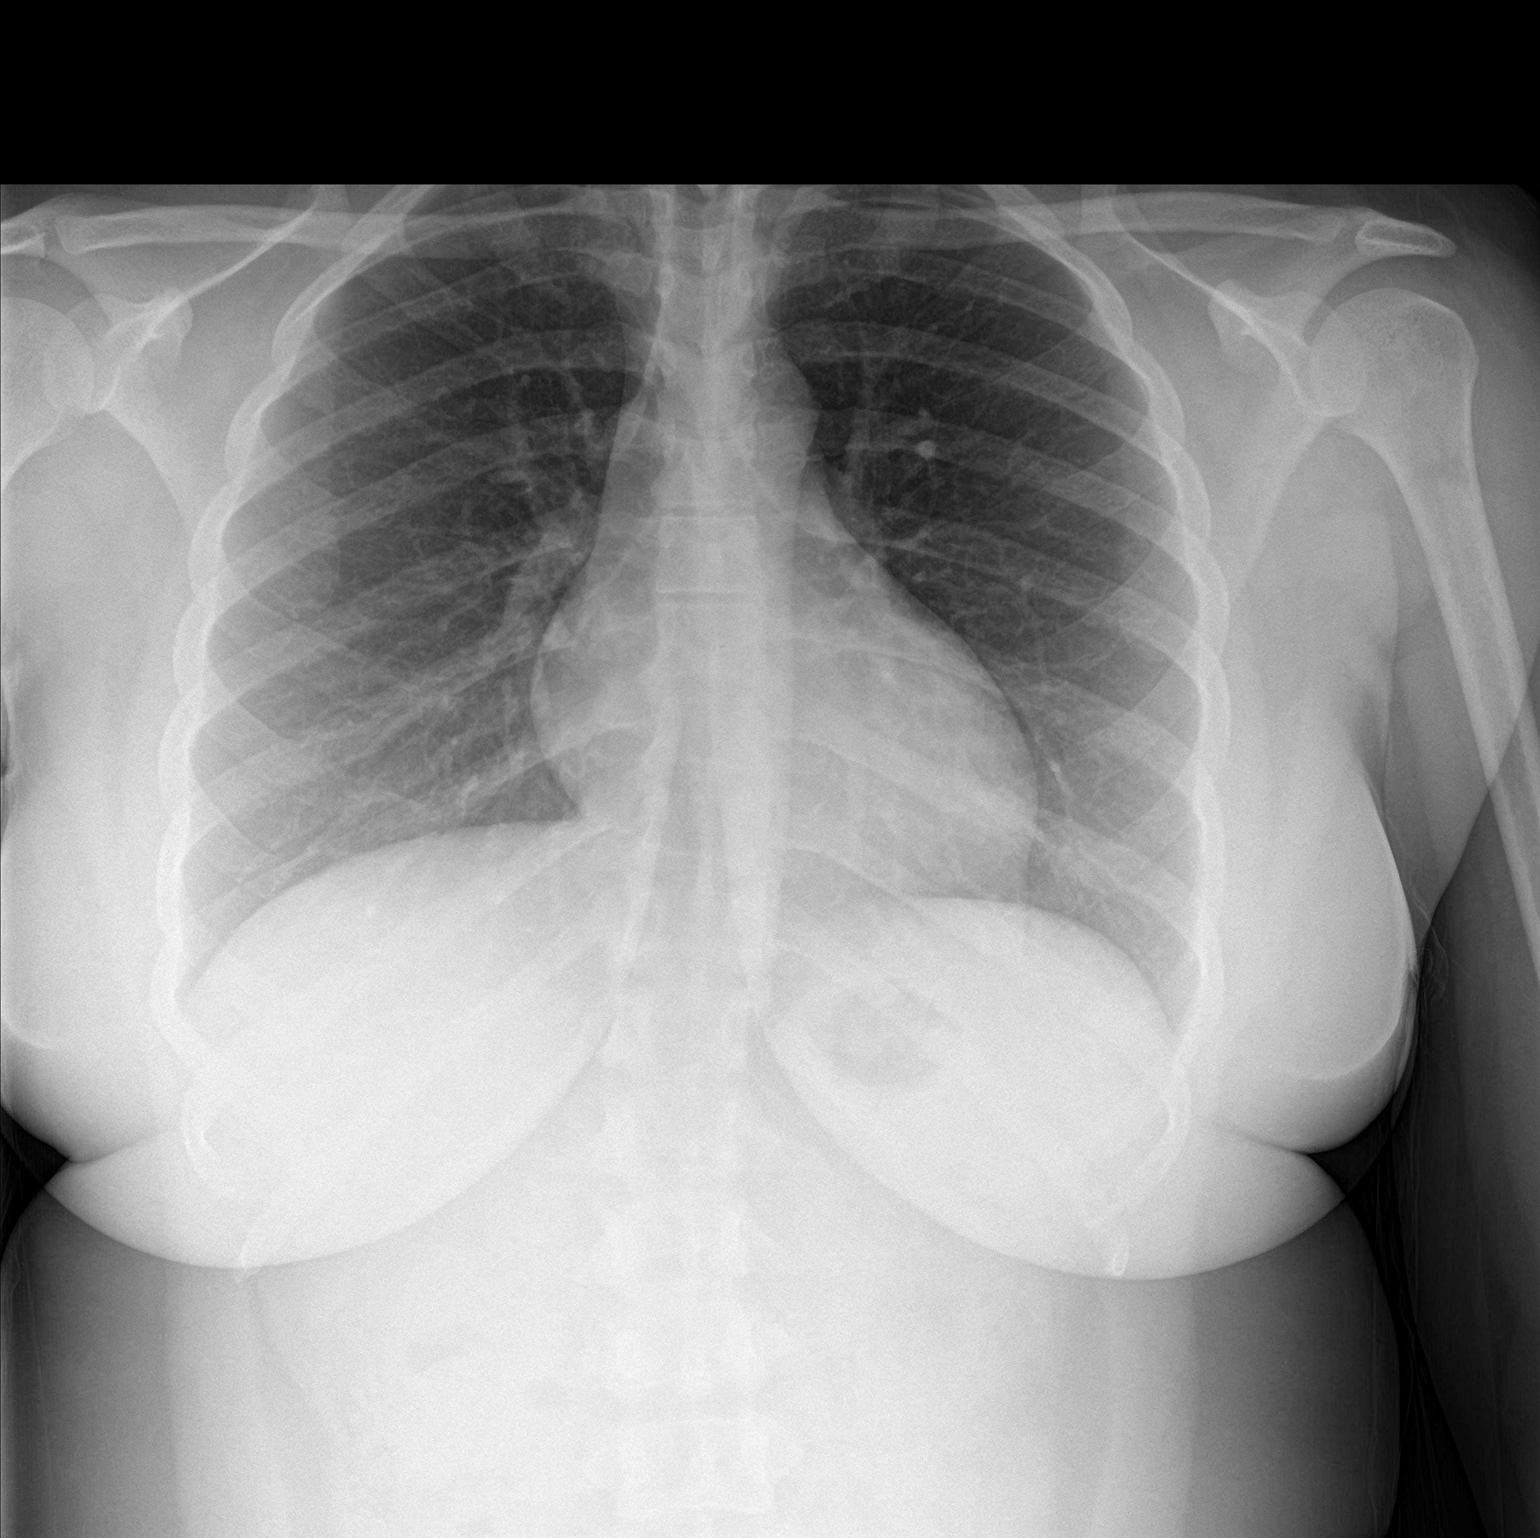

[chest lat]
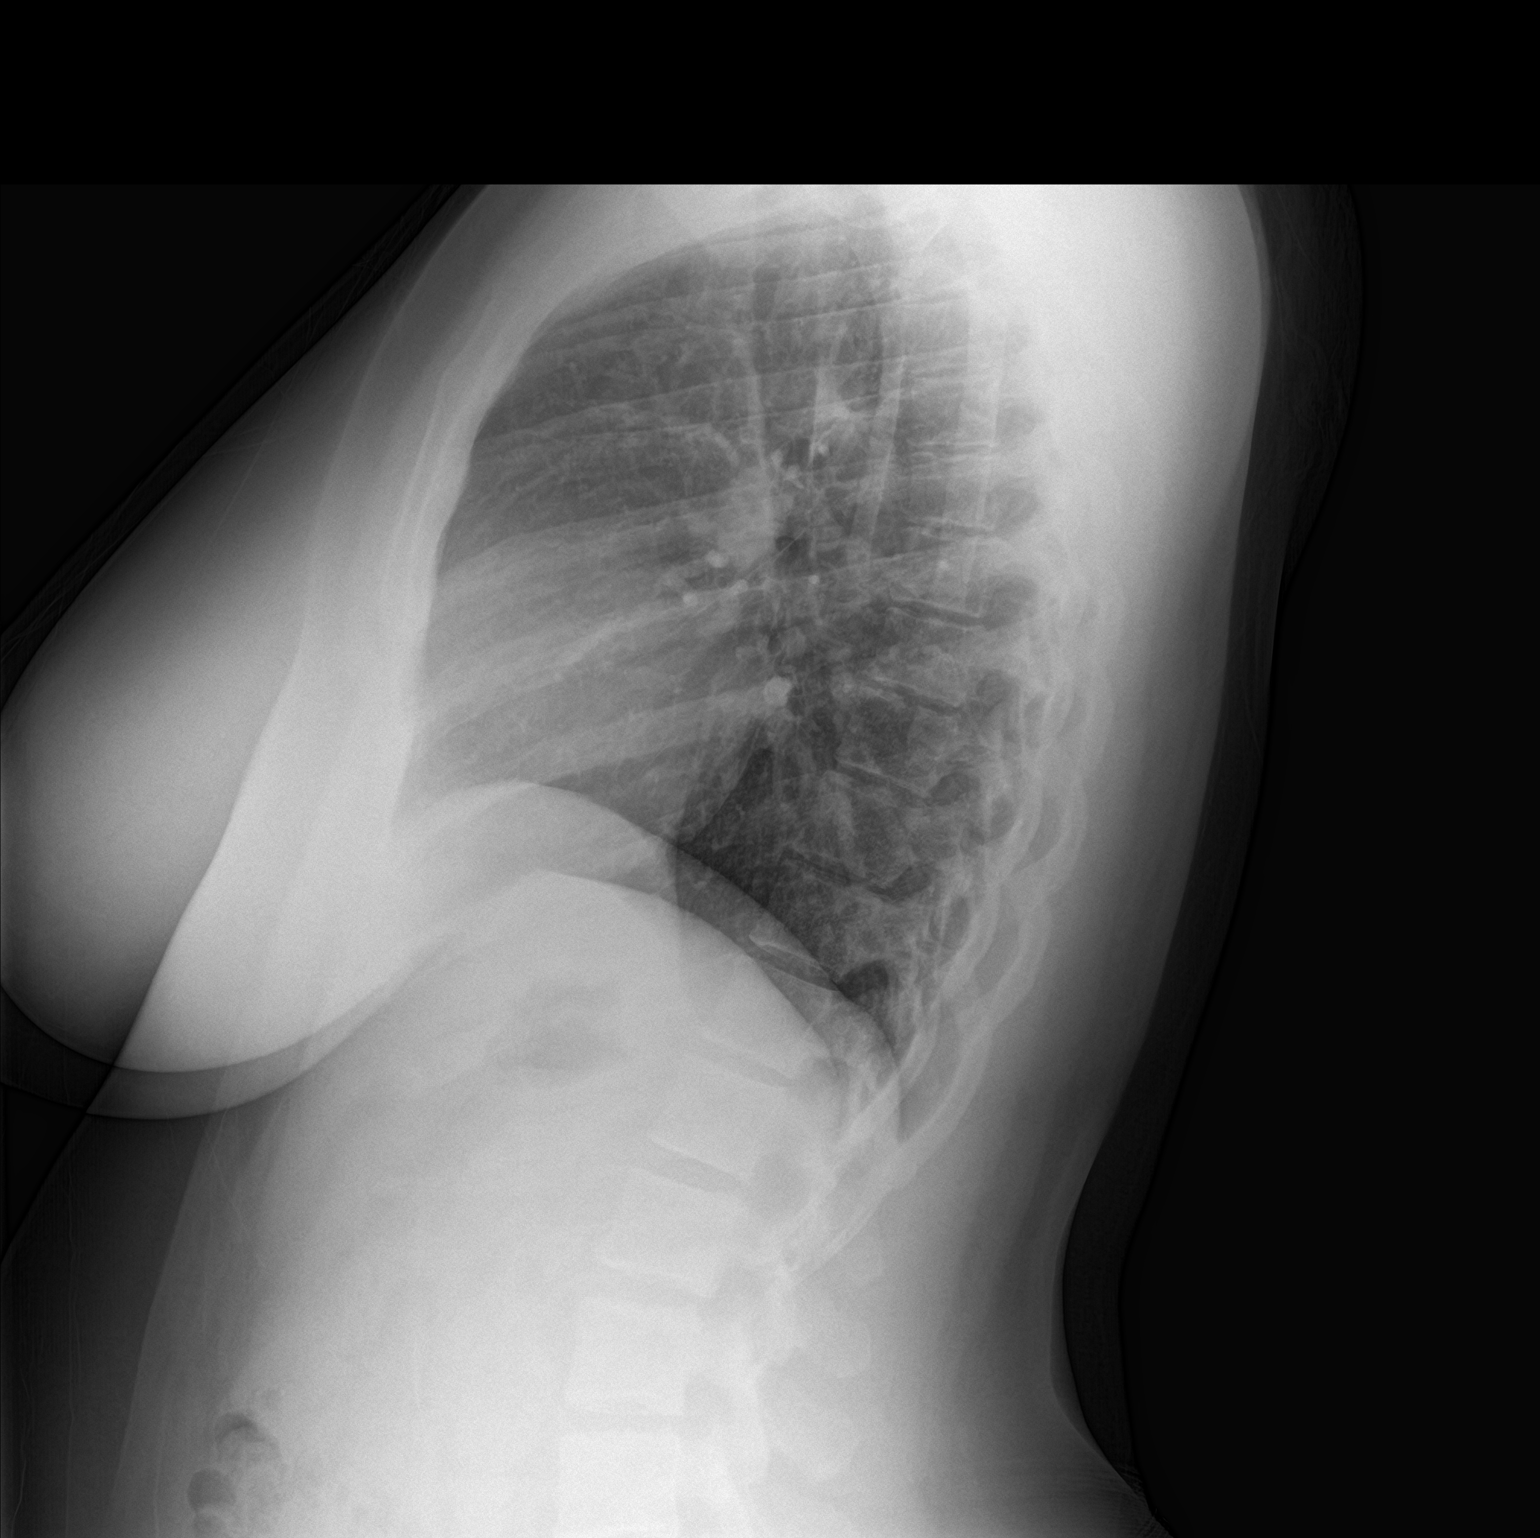

[2 of 2 positions shown; findings below may reference images not displayed]

FINDINGS: The heart size and mediastinal contours are within normal limits.
Both lungs are clear. No pleural effusion or pneumothorax. The
visualized skeletal structures are unremarkable.
IMPRESSION: Normal chest radiographs.

## 2018-04-19 IMAGING — US US ABDOMEN COMPLETE
1 series · 14 of 25 positions shown · non-contrast
Comparison: Abdominal and pelvic CT scan July 29, 2007

CLINICAL DATA: Nausea and vomiting, 3 months of right upper
quadrant pain

EXAM:
ABDOMEN ULTRASOUND COMPLETE

[Series 1: us abdomen complete · 0.18mm/px · 14 of 72 slices shown]
[im 1/72]
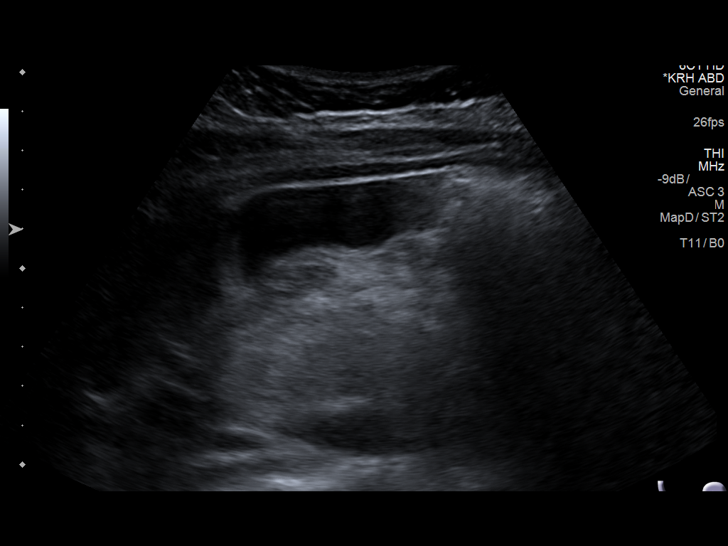
[im 6/72]
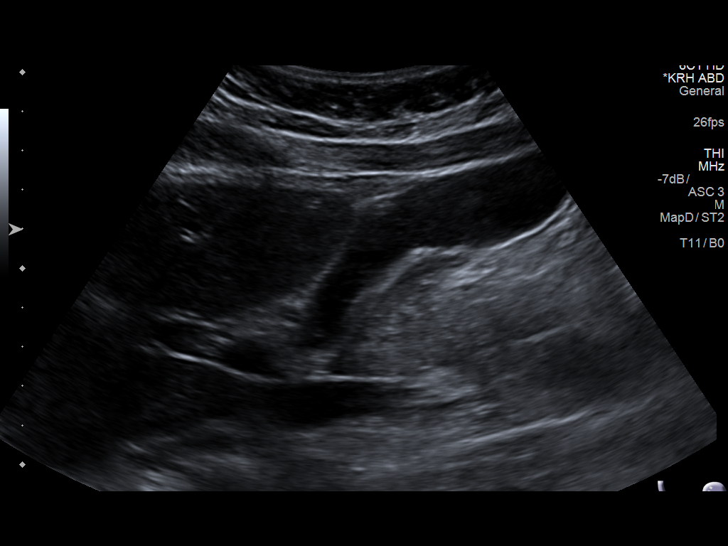
[im 12/72]
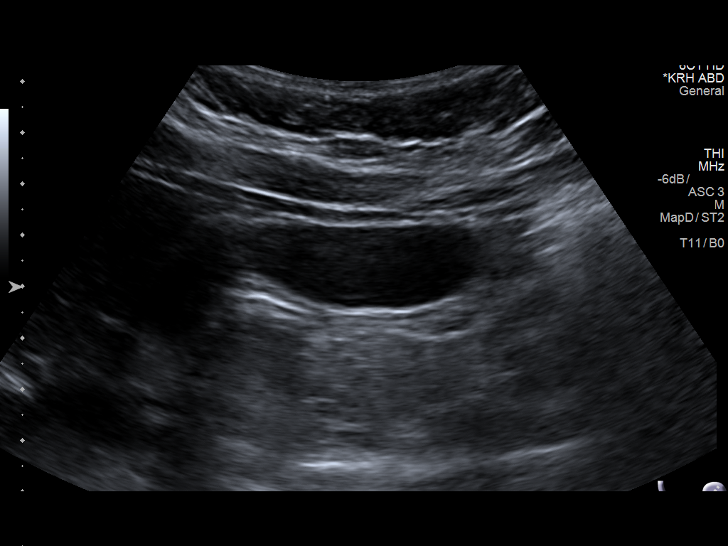
[im 18/72]
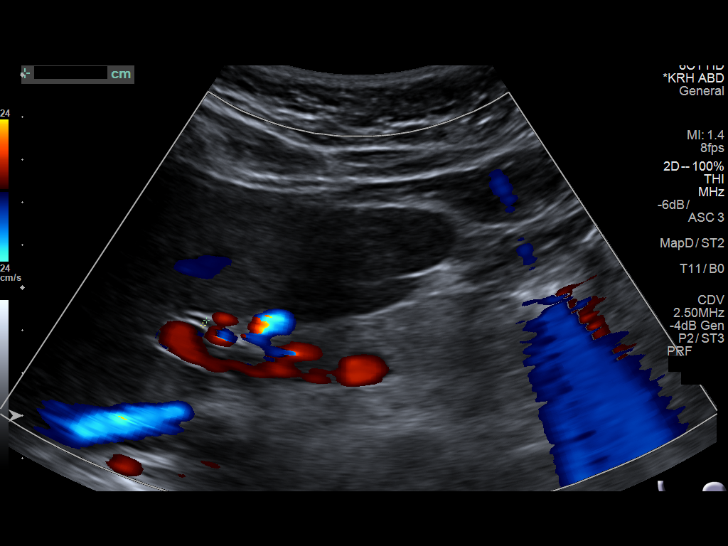
[im 24/72]
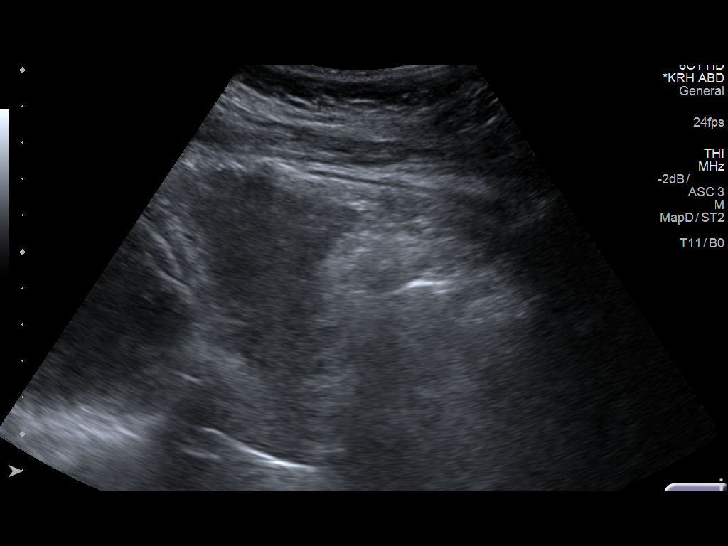
[im 27/72]
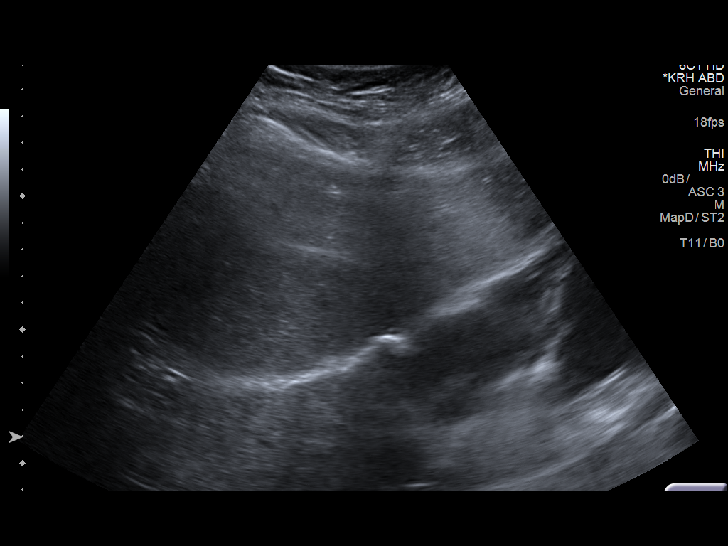
[im 33/72]
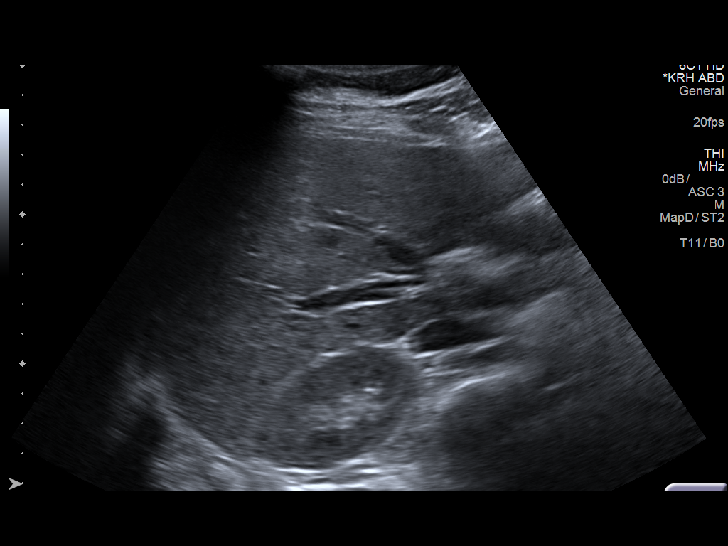
[im 39/72]
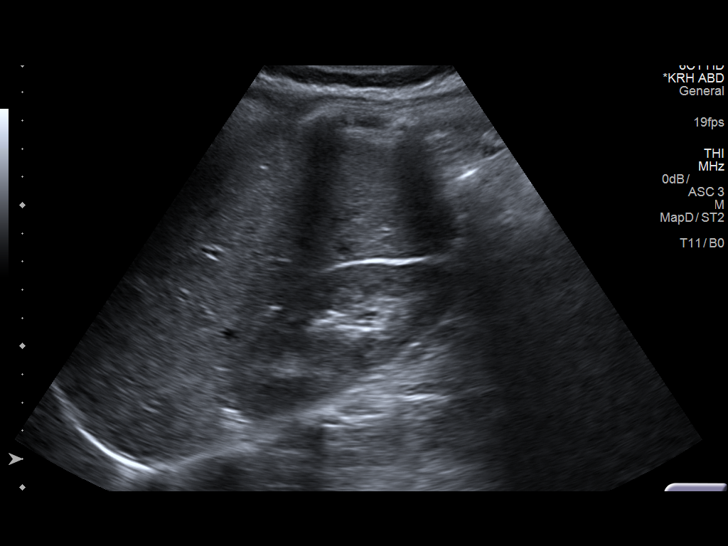
[im 45/72]
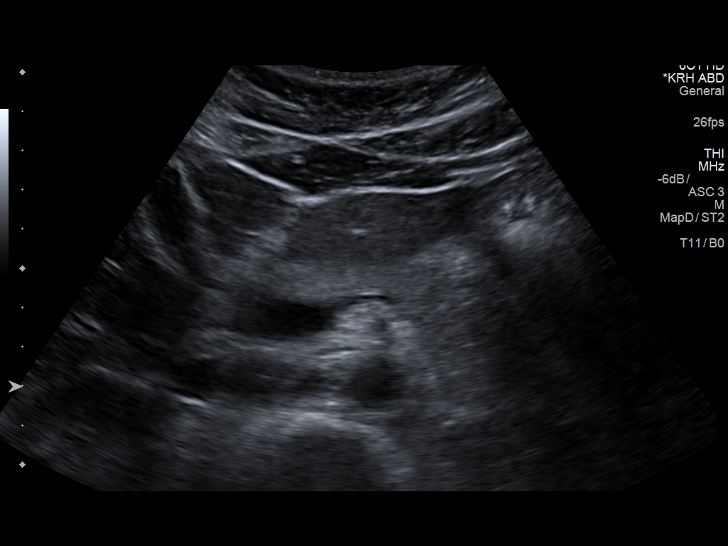
[im 48/72]
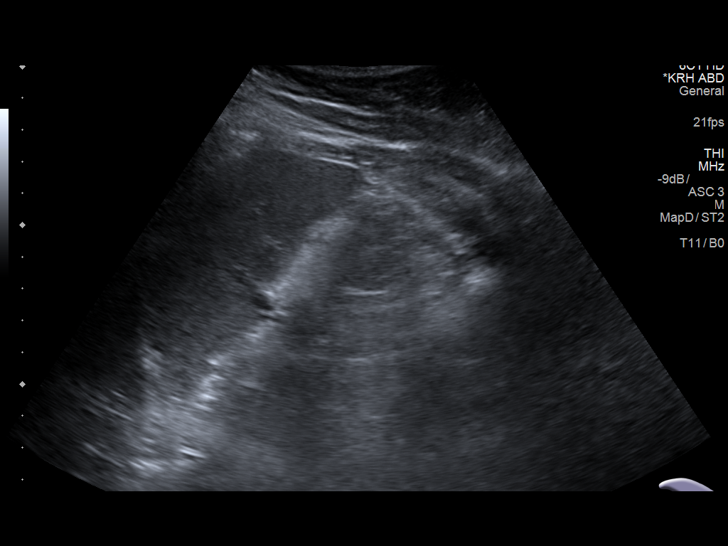
[im 54/72]
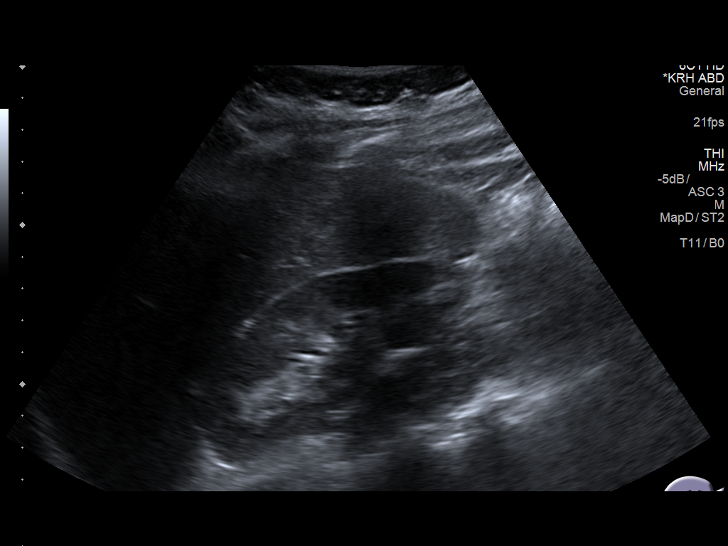
[im 60/72]
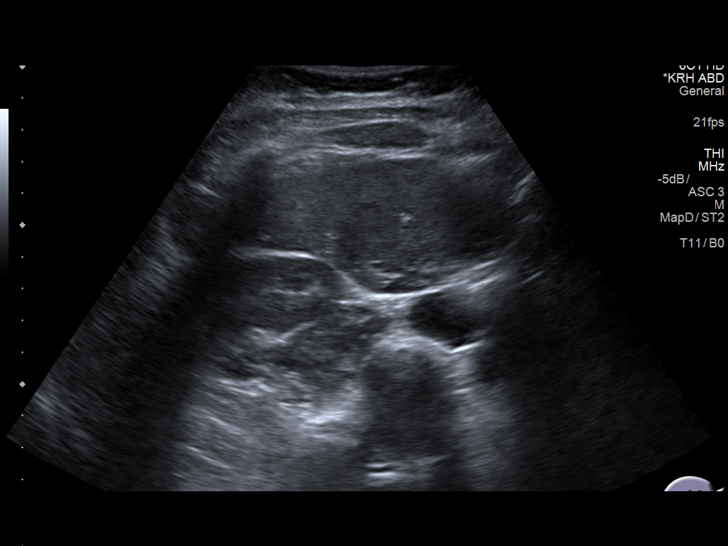
[im 66/72]
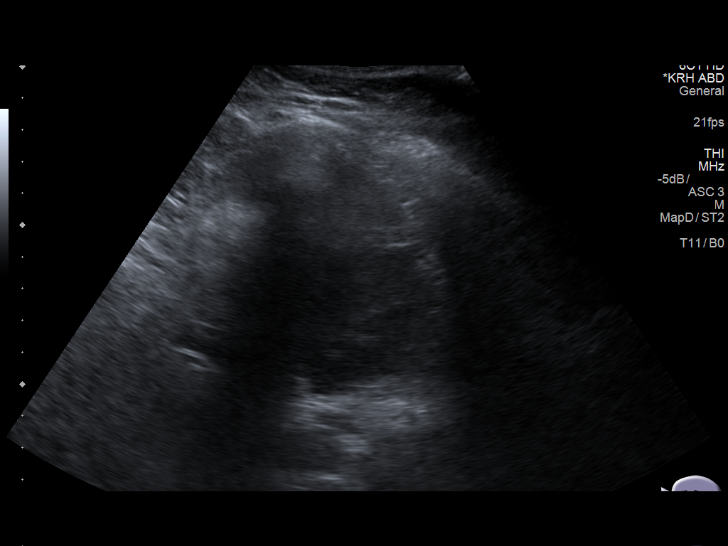
[im 72/72]
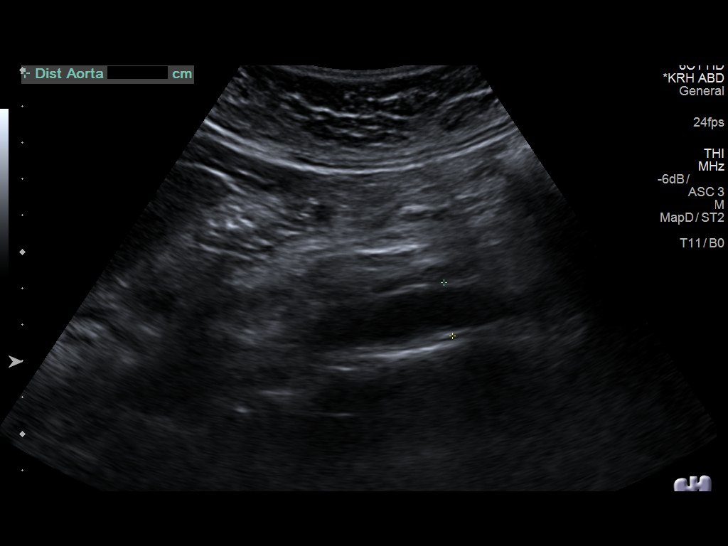

[14 of 25 positions shown; findings below may reference images not displayed]

FINDINGS: Gallbladder: No gallstones or wall thickening visualized. No
sonographic Murphy sign noted by sonographer.

Common bile duct: Diameter: 1.4 mm

Liver: No focal lesion identified. Within normal limits in
parenchymal echogenicity.

IVC: No abnormality visualized.

Pancreas: Visualized portion unremarkable.

Spleen: Size and appearance within normal limits.

Right Kidney: Length: 9.9 cm. Echogenicity within normal limits. No
mass or hydronephrosis visualized.

Left Kidney: Length: 10.4 cm. Echogenicity within normal limits. No
mass or hydronephrosis visualized.

Abdominal aorta: No aneurysm visualized.

Other findings: There is no ascites.
IMPRESSION: No gallstones or sonographic evidence of acute cholecystitis. If
there are clinical concerns of chronic cholecystitis, a nuclear
medicine hepatobiliary scan with gallbladder ejection fraction
determination may be useful.

No acute abnormality is observed within the abdomen.

## 2018-06-22 ENCOUNTER — Ambulatory Visit: Payer: 59 | Admitting: Family

## 2018-06-22 VITALS — BP 141/89 | HR 93 | Temp 98.6°F | Resp 18 | Wt 170.4 lb

## 2018-06-22 DIAGNOSIS — R030 Elevated blood-pressure reading, without diagnosis of hypertension: Secondary | ICD-10-CM | POA: Diagnosis not present

## 2018-06-22 DIAGNOSIS — F4321 Adjustment disorder with depressed mood: Secondary | ICD-10-CM

## 2018-06-22 DIAGNOSIS — E119 Type 2 diabetes mellitus without complications: Secondary | ICD-10-CM | POA: Diagnosis not present

## 2018-06-22 LAB — BASIC METABOLIC PANEL
BUN: 10 mg/dL (ref 6–23)
CALCIUM: 9.3 mg/dL (ref 8.4–10.5)
CO2: 26 mEq/L (ref 19–32)
Chloride: 105 mEq/L (ref 96–112)
Creatinine, Ser: 0.8 mg/dL (ref 0.40–1.20)
GFR: 95.08 mL/min (ref >60.00)
GLUCOSE: 136 mg/dL — AB (ref 70–99)
POTASSIUM: 3.9 meq/L (ref 3.5–5.1)
SODIUM: 138 meq/L (ref 135–145)

## 2018-06-22 LAB — HEMOGLOBIN A1C: HEMOGLOBIN A1C: 6.3 % (ref 4.6–6.5)

## 2018-06-22 NOTE — Progress Notes (Signed)
Subjective:    Patient ID: Rose Hansen, female    DOB: [birth year], 43 y.o.   MRN: [id number]  HPI  Patient is a 43 yr old female who presents today for follow up.  DM2- Reports that she has been stress eating. Will sit on couch and eat "a whole bag of sour cream and onion potato chips."  Then feels bad mentally and physically about it.  Lab Results  Component Value Date   HGBA1C 6.2 03/19/2018   HGBA1C 6.1 12/16/2017   HGBA1C 6.0 09/11/2017   Lab Results  Component Value Date   MICROALBUR <0.7 03/19/2018   LDLCALC 44 12/16/2017   CREATININE 0.83 03/19/2018   Grief reaction- reports that she had a tough time over the holidays since this was the first year without her mom.  Reports that the "sting has improved" a bit but still there.     Review of Systems    see HPI  Past Medical History:  Diagnosis Date  . Abnormal glucose   . Circadian rhythm sleep disorder, shift work type   . Diabetes type 2, controlled (HCC) 08/15/2016  . Fatty liver 08/22/2016  . Fibromyalgia   . History of UTI   . Mild anemia   . Nonspecific reaction to tuberculin skin test without active tuberculosis(795.51)      Social History   Socioeconomic History  . Marital status: Single    Spouse name: Not on file  . Number of children: 0  . Years of education: Not on file  . Highest education level: Not on file  Occupational History  . Occupation: dialysis nurse at [employer]  . Financial resource strain: Not on file  . Food insecurity:    Worry: Not on file    Inability: Not on file  . Transportation needs:    Medical: Not on file    Non-medical: Not on file  Tobacco Use  . Smoking status: Never Smoker  . Smokeless tobacco: Never Used  Substance and Sexual Activity  . Alcohol use: No  . Drug use: No  . Sexual activity: Not on file  Lifestyle  . Physical activity:    Days per week: Not on file    Minutes per session: Not on file  . Stress: Not on file    Relationships  . Social connections:    Talks on phone: Not on file    Gets together: Not on file    Attends religious service: Not on file    Active member of club or organization: Not on file    Attends meetings of clubs or organizations: Not on file    Relationship status: Not on file  . Intimate partner violence:    Fear of current or ex partner: Not on file    Emotionally abused: Not on file    Physically abused: Not on file    Forced sexual activity: Not on file  Other Topics Concern  . 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 gyn surgery  08/2007   Dr. Jennette Kettle for benign cystic teratomas of right ovary,uterine fibroids, follicular cyst of left ovary    Family History  Problem Relation Age of Onset  . Hypertension Father   .  Colon polyps Father   . Alzheimer's disease Mother 8864       end stage dementia  . Asthma Sister   . Colon cancer Maternal Grandfather 45    Allergies  Allergen Reactions  . Pylera [Bis Subcit-Metronid-Tetracyc] Swelling    Swelling of face, eyes, lips.  . Bismuth-Containing Compounds Swelling  . Metronidazole Swelling  . Tetracyclines & Related Swelling    Current Outpatient Medications on File Prior to Visit  Medication Sig Dispense Refill  . JUNEL FE 1/20 1-20 MG-MCG tablet Take 1 tablet by mouth at bedtime.      Current Facility-Administered Medications on File Prior to Visit  Medication Dose Route Frequency Provider Last Rate Last Dose  . 0.9 %  sodium chloride infusion  500 mL Intravenous Continuous Danis, Starr LakeHenry L III, MD        BP (!) 141/89 (BP Location: Left Arm, Patient Position: Sitting, Cuff Size: Normal)   Pulse 93   Temp 98.6 F (37 C) (Oral)   Resp 18   Wt 170 lb 6.4 oz (77.3 kg)   SpO2 100%    BMI 33.28 kg/m    Objective:   Physical Exam Constitutional:      Appearance: She is well-developed.  Neck:     Musculoskeletal: Neck supple.     Thyroid: No thyromegaly.  Cardiovascular:     Rate and Rhythm: Normal rate and regular rhythm.     Heart sounds: Normal heart sounds. No murmur.  Pulmonary:     Effort: Pulmonary effort is normal. No respiratory distress.     Breath sounds: Normal breath sounds. No wheezing.  Skin:    General: Skin is warm and dry.  Neurological:     Mental Status: She is alert and oriented to person, place, and time.  Psychiatric:        Behavior: Behavior normal.        Thought Content: Thought content normal.        Judgment: Judgment normal.           Assessment & Plan:  DM2- we discussed not eating in front of the television and making better food choices. Obtain follow up A1C/BMET.  Grief reaction-  She is improving slowly.  Support provided.    Elevated blood pressure reading- bp up slightly today. She has been eating a high sodium diet. Plan to repeat in 3 months if still elevated consider antihypertensive.  BP Readings from Last 3 Encounters:  06/22/18 (!) 141/89  03/19/18 (!) 124/94  01/15/18 128/70

## 2018-06-22 NOTE — Patient Instructions (Signed)
Please complete lab work prior to leaving.  Try not to eat in front of the television.

## 2018-07-19 DIAGNOSIS — E119 Type 2 diabetes mellitus without complications: Secondary | ICD-10-CM | POA: Diagnosis not present

## 2018-07-19 LAB — HM DIABETES EYE EXAM

## 2018-07-21 ENCOUNTER — Telehealth: Payer: Self-pay | Admitting: *Deleted

## 2018-07-21 NOTE — Telephone Encounter (Signed)
Received Diabetic Eye Exam Report from [facility]; forwarded to provider/SLS 02/19

## 2018-09-17 ENCOUNTER — Telehealth: Payer: Self-pay

## 2018-09-17 NOTE — Telephone Encounter (Signed)
Copied from CRM [id number]. Topic: General - Other >> Sep 16, 2018  6:22 PM Trula Slade wrote: Reason for CRM:   Patient would like to reschedule her 09/22/2018 appt.

## 2018-09-17 NOTE — Telephone Encounter (Signed)
tient to reschedule appointment. Left message for return call.

## 2018-09-17 NOTE — Telephone Encounter (Signed)
Patient is calling Clydie Braun back to reschedule her appt. CB- [phone]

## 2018-09-20 NOTE — Telephone Encounter (Signed)
Returned patient's call.  Left message for call back.

## 2018-09-21 ENCOUNTER — Ambulatory Visit: Payer: 59 | Admitting: Family

## 2018-09-22 ENCOUNTER — Ambulatory Visit: Payer: 59 | Admitting: Family

## 2018-11-01 ENCOUNTER — Ambulatory Visit: Payer: 59 | Admitting: Family

## 2018-11-29 ENCOUNTER — Other Ambulatory Visit: Payer: Self-pay

## 2018-11-29 ENCOUNTER — Encounter: Payer: Self-pay | Admitting: Family

## 2018-11-29 ENCOUNTER — Ambulatory Visit (INDEPENDENT_AMBULATORY_CARE_PROVIDER_SITE_OTHER): Payer: 59 | Admitting: Family

## 2018-11-29 DIAGNOSIS — N921 Excessive and frequent menstruation with irregular cycle: Secondary | ICD-10-CM | POA: Diagnosis not present

## 2018-11-29 DIAGNOSIS — F4321 Adjustment disorder with depressed mood: Secondary | ICD-10-CM

## 2018-11-29 DIAGNOSIS — Z9289 Personal history of other medical treatment: Secondary | ICD-10-CM | POA: Diagnosis not present

## 2018-11-29 DIAGNOSIS — E119 Type 2 diabetes mellitus without complications: Secondary | ICD-10-CM

## 2018-11-29 DIAGNOSIS — R030 Elevated blood-pressure reading, without diagnosis of hypertension: Secondary | ICD-10-CM | POA: Diagnosis not present

## 2018-11-29 NOTE — Progress Notes (Signed)
Virtual Visit via Video Note  I connected with Rose Hansen on 11/29/18 at  8:40 AM EDT by a video enabled telemedicine application and verified that I am speaking with the correct person using two identifiers.  Location: Patient: parked car Provider: home   I discussed the limitations of evaluation and management by telemedicine and the availability of in person appointments. The patient expressed understanding and agreed to proceed.  History of Present Illness:  Patient is a 43 yr old female who presents today for follow up.  1) DM2- this has been diet controlled. Reports that her weight is 164.7. reports that her blood sugars have been running  120-130.  Wt Readings from Last 3 Encounters:  06/22/18 170 lb 6.4 oz (77.3 kg)  03/19/18 165 lb (74.8 kg)  01/15/18 157 lb 4 oz (71.3 kg)    Lab Results  Component Value Date   HGBA1C 6.3 06/22/2018   HGBA1C 6.2 03/19/2018   HGBA1C 6.1 12/16/2017   Lab Results  Component Value Date   MICROALBUR <0.7 03/19/2018   LDLCALC 44 12/16/2017   CREATININE 0.80 06/22/2018   2) Elevated blood pressure reading-  Today 142/80.  BP Readings from Last 3 Encounters:  06/22/18 (!) 141/89  03/19/18 (!) 124/94  01/15/18 128/70   3) Menorhagia- Reports that she started bleeding in March and has not stopped. She is following with GYN who has placed her on an iron supplement due to her anemia.  She has had some fatigue.  She is scheduled for a hysterectomy next week.  4) Grief reaction- reports that she went to grief counseling through hospice with her father and sister following the loss of her mother. Reports that she found this very helpful and is doing "much better."    Past Medical History:  Diagnosis Date  . Abnormal glucose   . Circadian rhythm sleep disorder, shift work type   . Diabetes type 2, controlled (HCC) 08/15/2016  . Fatty liver 08/22/2016  . Fibromyalgia   . History of UTI   . Mild anemia   . Nonspecific reaction to  tuberculin skin test without active tuberculosis(795.51)      Social History   Socioeconomic History  . Marital status: Single    Spouse name: Not on file  . Number of children: 0  . Years of education: Not on file  . Highest education level: Not on file  Occupational History  . Occupation: dialysis nurse at [employer]  Social Needs  . Financial resource strain: Not on file  . Food insecurity    Worry: Not on file    Inability: Not on file  . Transportation needs    Medical: Not on file    Non-medical: Not on file  Tobacco Use  . Smoking status: Never Smoker  . Smokeless tobacco: Never Used  Substance and Sexual Activity  . Alcohol use: No  . Drug use: No  . Sexual activity: Not on file  Lifestyle  . Physical activity    Days per week: Not on file    Minutes per session: Not on file  . Stress: Not on file  Relationships  . Social Musicianconnections    Talks on phone: Not on file    Gets together: Not on file    Attends religious service: Not on file    Active member of club or organization: Not on file    Attends meetings of clubs or organizations: Not on file    Relationship status: Not on  file  . Intimate partner violence    Fear of current or ex partner: Not on file    Emotionally abused: Not on file    Physically abused: Not on file    Forced sexual activity: Not on file  Other Topics Concern  . Not on file  Social History Narrative   Mom- alhzheimers   Single   Dialysis RN- Occupational psychologist (works days)    Completed college   No children   Enjoys reading and spending time with neices and nephews    Past Surgical History:  Procedure Laterality Date  . gyn surgery  08/2007   Dr. Nori Riis for benign cystic teratomas of right ovary,uterine fibroids, follicular cyst of left ovary    Family History  Problem Relation Age of Onset  . Hypertension Father   . Colon polyps Father   . Alzheimer's disease Mother 23       end stage dementia  . Asthma Sister   .  Colon cancer Maternal Grandfather 45    Allergies  Allergen Reactions  . Pylera [Bis Subcit-Metronid-Tetracyc] Swelling    Swelling of face, eyes, lips.  . Bismuth-Containing Compounds Swelling  . Metronidazole Swelling  . Tetracyclines & Related Swelling    Current Outpatient Medications on File Prior to Visit  Medication Sig Dispense Refill  . JUNEL FE 1/20 1-20 MG-MCG tablet Take 1 tablet by mouth at bedtime.      Current Facility-Administered Medications on File Prior to Visit  Medication Dose Route Frequency Provider Last Rate Last Dose  . 0.9 %  sodium chloride infusion  500 mL Intravenous Continuous Nelida Meuse III, MD        There were no vitals taken for this visit.    Objective   Gen: Awake, alert, no acute distress Resp: Breathing is even and non-labored Psych: calm/pleasant demeanor Neuro: Alert and Oriented x 3, + facial symmetry, speech is clear.    Assessment and Plan:  1) Elevated blood pressure reading- will plan to bring her back in 1 month for nurse visit bp check.  If sbp remains >130 will add lisinopril 10mg  for bp control and renal protection. At that point she will have completed her hysterectomy and we won't need to worry about contraception on the ACE inhibitor.  2) Grief reaction- much better following grief counseling. Monitor.  3) Menorrhagia- will undergo hysterectomy next week. This should help with her anemia as well.  4) DM2- clinically stable. Plan to check follow up labs when she returns for nurse visit bp check.  5) Hx of PPD positive- requests follow up CXR.   Follow Up Instructions:    I discussed the assessment and treatment plan with the patient. The patient was provided an opportunity to ask questions and all were answered. The patient agreed with the plan and demonstrated an understanding of the instructions.   The patient was advised to call back or seek an in-person evaluation if the symptoms worsen or if the condition  fails to improve as anticipated.  Nance Pear, NP

## 2018-12-02 ENCOUNTER — Ambulatory Visit (HOSPITAL_BASED_OUTPATIENT_CLINIC_OR_DEPARTMENT_OTHER)
Admission: RE | Admit: 2018-12-02 | Discharge: 2018-12-02 | Disposition: A | Discharge status: Home / Self Care | Payer: 59 | Source: Ambulatory Visit | Attending: Family | Admitting: Family

## 2018-12-02 ENCOUNTER — Other Ambulatory Visit: Payer: Self-pay

## 2018-12-02 DIAGNOSIS — Z9289 Personal history of other medical treatment: Secondary | ICD-10-CM | POA: Insufficient documentation

## 2018-12-06 ENCOUNTER — Other Ambulatory Visit: Payer: Self-pay | Admitting: Obstetrics & Gynecology

## 2018-12-07 HISTORY — PX: TOTAL ABDOMINAL HYSTERECTOMY: SHX209

## 2018-12-07 HISTORY — PX: ABDOMINAL HYSTERECTOMY: SHX81

## 2019-01-04 ENCOUNTER — Other Ambulatory Visit (INDEPENDENT_AMBULATORY_CARE_PROVIDER_SITE_OTHER): Payer: 59

## 2019-01-04 ENCOUNTER — Other Ambulatory Visit: Payer: Self-pay

## 2019-01-04 DIAGNOSIS — E119 Type 2 diabetes mellitus without complications: Secondary | ICD-10-CM

## 2019-01-04 LAB — COMPREHENSIVE METABOLIC PANEL
ALT: 21 U/L (ref 0–35)
AST: 16 U/L (ref 0–37)
Albumin: 4.5 g/dL (ref 3.5–5.2)
Alkaline Phosphatase: 79 U/L (ref 39–117)
BUN: 9 mg/dL (ref 6–23)
CO2: 26 mEq/L (ref 19–32)
Calcium: 9.6 mg/dL (ref 8.4–10.5)
Chloride: 102 mEq/L (ref 96–112)
Creatinine, Ser: 0.75 mg/dL (ref 0.40–1.20)
GFR: 102.17 mL/min (ref >60.00)
Glucose, Bld: 132 mg/dL — ABNORMAL HIGH (ref 70–99)
Potassium: 4.1 mEq/L (ref 3.5–5.1)
Sodium: 138 mEq/L (ref 135–145)
Total Bilirubin: 0.4 mg/dL (ref 0.2–1.2)
Total Protein: 7 g/dL (ref 6.0–8.3)

## 2019-01-04 LAB — LIPID PANEL
Cholesterol: 156 mg/dL (ref 0–200)
HDL: 52.6 mg/dL (ref >39.00)
LDL Cholesterol: 83 mg/dL (ref 0–99)
NonHDL: 102.96
Total CHOL/HDL Ratio: 3
Triglycerides: 100 mg/dL (ref 0.0–149.0)
VLDL: 20 mg/dL (ref 0.0–40.0)

## 2019-01-04 LAB — HEMOGLOBIN A1C: Hgb A1c MFr Bld: 6.5 % (ref 4.6–6.5)

## 2019-01-26 LAB — HM MAMMOGRAPHY

## 2019-02-24 ENCOUNTER — Ambulatory Visit (INDEPENDENT_AMBULATORY_CARE_PROVIDER_SITE_OTHER): Payer: 59

## 2019-02-24 ENCOUNTER — Other Ambulatory Visit: Payer: Self-pay

## 2019-02-24 DIAGNOSIS — Z23 Encounter for immunization: Secondary | ICD-10-CM | POA: Diagnosis not present

## 2019-02-24 NOTE — Progress Notes (Signed)
Here for flu shot

## 2019-05-12 ENCOUNTER — Telehealth: Payer: Self-pay | Admitting: *Deleted

## 2019-05-12 NOTE — Telephone Encounter (Signed)
Copied from [place] [id number]. Topic: General - Other >> May 12, 2019  3:36 PM Oneta Rack wrote: Reason for CRM:   Patient states its time for her to have labs done only, patient requesting labs, please advise

## 2019-05-13 NOTE — Telephone Encounter (Signed)
Lets start with a virtual visit first and then we can order labs at that time.

## 2019-05-14 NOTE — Telephone Encounter (Signed)
Patient was scheduled for 06-07-2019

## 2019-06-07 ENCOUNTER — Ambulatory Visit: Payer: 59 | Admitting: Family

## 2019-06-14 ENCOUNTER — Encounter: Payer: Self-pay | Admitting: Family

## 2019-06-14 ENCOUNTER — Ambulatory Visit: Payer: 59 | Admitting: Family

## 2019-06-14 ENCOUNTER — Other Ambulatory Visit: Payer: Self-pay

## 2019-06-14 VITALS — BP 130/83 | HR 94 | Temp 96.4°F | Resp 16 | Ht 59.0 in | Wt 157.0 lb

## 2019-06-14 DIAGNOSIS — E119 Type 2 diabetes mellitus without complications: Secondary | ICD-10-CM | POA: Diagnosis not present

## 2019-06-14 DIAGNOSIS — Z23 Encounter for immunization: Secondary | ICD-10-CM | POA: Diagnosis not present

## 2019-06-14 DIAGNOSIS — Z114 Encounter for screening for human immunodeficiency virus [HIV]: Secondary | ICD-10-CM

## 2019-06-14 LAB — MICROALBUMIN / CREATININE URINE RATIO
Creatinine,U: 170.1 mg/dL
Microalb Creat Ratio: 2 mg/g (ref 0.0–30.0)
Microalb, Ur: 3.5 mg/dL — ABNORMAL HIGH (ref 0.0–1.9)

## 2019-06-14 LAB — COMPREHENSIVE METABOLIC PANEL
ALT: 12 U/L (ref 0–35)
AST: 14 U/L (ref 0–37)
Albumin: 4.6 g/dL (ref 3.5–5.2)
Alkaline Phosphatase: 100 U/L (ref 39–117)
BUN: 8 mg/dL (ref 6–23)
CO2: 29 mEq/L (ref 19–32)
Calcium: 9.9 mg/dL (ref 8.4–10.5)
Chloride: 103 mEq/L (ref 96–112)
Creatinine, Ser: 0.84 mg/dL (ref 0.40–1.20)
GFR: 89.46 mL/min (ref >60.00)
Glucose, Bld: 110 mg/dL — ABNORMAL HIGH (ref 70–99)
Potassium: 4.3 mEq/L (ref 3.5–5.1)
Sodium: 140 mEq/L (ref 135–145)
Total Bilirubin: 0.6 mg/dL (ref 0.2–1.2)
Total Protein: 7.4 g/dL (ref 6.0–8.3)

## 2019-06-14 LAB — LIPID PANEL
Cholesterol: 159 mg/dL (ref 0–200)
HDL: 46.8 mg/dL (ref >39.00)
LDL Cholesterol: 99 mg/dL (ref 0–99)
NonHDL: 111.75
Total CHOL/HDL Ratio: 3
Triglycerides: 65 mg/dL (ref 0.0–149.0)
VLDL: 13 mg/dL (ref 0.0–40.0)

## 2019-06-14 LAB — HEMOGLOBIN A1C: Hgb A1c MFr Bld: 6 % (ref 4.6–6.5)

## 2019-06-14 MED ORDER — ATORVASTATIN CALCIUM 10 MG PO TABS: 10.0000 mg | ORAL_TABLET | Freq: Every day | ORAL | 1 refills | Status: DC

## 2019-06-14 NOTE — Patient Instructions (Signed)
Great work on [employer] loss! Please complete lab work prior to leaving.

## 2019-06-14 NOTE — Progress Notes (Signed)
Subjective:    Patient ID: Rose Hansen, female    DOB: 1976/03/14, 44 y.o.   MRN: [id number]  HPI   Patient is a 44 year old female who presents today for routine follow-up.   DM2- has been working on her diet.  She has lost 13 pounds since her last visit.  Wt Readings from Last 3 Encounters:  06/14/19 157 lb (71.2 kg)  06/22/18 170 lb 6.4 oz (77.3 kg)  03/19/18 165 lb (74.8 kg)    Lab Results  Component Value Date   HGBA1C 6.5 01/04/2019   HGBA1C 6.3 06/22/2018   HGBA1C 6.2 03/19/2018   Lab Results  Component Value Date   MICROALBUR <0.7 03/19/2018   LDLCALC 83 01/04/2019   CREATININE 0.75 01/04/2019       Review of Systems    see HPI  Past Medical History:  Diagnosis Date  . Abnormal glucose   . Circadian rhythm sleep disorder, shift work type   . Diabetes type 2, controlled (HCC) 08/15/2016  . Fatty liver 08/22/2016  . Fibromyalgia   . History of UTI   . Mild anemia   . Nonspecific reaction to tuberculin skin test without active tuberculosis(795.51)      Social History   Socioeconomic History  . Marital status: Single    Spouse name: Not on file  . Number of children: 0  . Years of education: Not on file  . Highest education level: Not on file  Occupational History  . Occupation: dialysis nurse at [employer]  . Smoking status: Never Smoker  . Smokeless tobacco: Never Used  Substance and Sexual Activity  . Alcohol use: No  . Drug use: No  . Sexual activity: Not on file  Other Topics Concern  . Not on file  Social History Narrative   Mom- alzheimers- passed away 2017/09/17   Single   Dialysis RN- clinical coordinator (works days)    Completed college   No children   Enjoys reading and spending time with nieces and nephews   Social Determinants of Corporate investment banker Strain:   . Difficulty of Paying Living Expenses: Not on file  Food Insecurity:   . Worried About Programme researcher, broadcasting/film/video in the Last Year: Not on file    . Ran Out of Food in the Last Year: Not on file  Transportation Needs:   . Lack of Transportation (Medical): Not on file  . Lack of Transportation (Non-Medical): Not on file  Physical Activity:   . Days of Exercise per Week: Not on file  . Minutes of Exercise per Session: Not on file  Stress:   . Feeling of Stress : Not on file  Social Connections:   . Frequency of Communication with Friends and Family: Not on file  . Frequency of Social Gatherings with Friends and Family: Not on file  . Attends Religious Services: Not on file  . Active Member of Clubs or Organizations: Not on file  . Attends Banker Meetings: Not on file  . Marital Status: Not on file  Intimate Partner Violence:   . Fear of Current or Ex-Partner: Not on file  . Emotionally Abused: Not on file  . Physically Abused: Not on file  . Sexually Abused: Not on file    Past Surgical History:  Procedure Laterality Date  . gyn surgery  08/2007   Dr. Jennette Kettle for benign cystic teratomas of right ovary,uterine fibroids, follicular cyst of left ovary  Family History  Problem Relation Age of Onset  . Hypertension Father   . Colon polyps Father   . Alzheimer's disease Mother 28       end stage dementia  . Asthma Sister   . Colon cancer Maternal Grandfather 45    Allergies  Allergen Reactions  . Pylera [Bis Subcit-Metronid-Tetracyc] Swelling    Swelling of face, eyes, lips.  . Bismuth-Containing Compounds Swelling  . Metronidazole Swelling  . Tetracyclines & Related Swelling    No current outpatient medications on file prior to visit.   Current Facility-Administered Medications on File Prior to Visit  Medication Dose Route Frequency Provider Last Rate Last Admin  . 0.9 %  sodium chloride infusion  500 mL Intravenous Continuous Danis, Estill Cotta III, MD        BP 130/83 (BP Location: Right Arm, Patient Position: Sitting, Cuff Size: Small)   Pulse 94   Temp (!) 96.4 F (35.8 C) (Temporal)   Resp 16    Ht 4\' 11"  (1.499 m)   Wt 157 lb (71.2 kg)   SpO2 100%   BMI 31.71 kg/m    Objective:   Physical Exam Constitutional:      Appearance: She is well-developed.  Cardiovascular:     Rate and Rhythm: Normal rate and regular rhythm.     Heart sounds: Normal heart sounds. No murmur.  Pulmonary:     Effort: Pulmonary effort is normal. No respiratory distress.     Breath sounds: Normal breath sounds. No wheezing.  Psychiatric:        Behavior: Behavior normal.        Thought Content: Thought content normal.        Judgment: Judgment normal.           Assessment & Plan:  DM2- will obtain A1C.  I commended her on her weight loss- it should have improved her A1C. She is s/p hysterectomy. Will add statin for CV risk reduction. Flu shot up to date. Due for tetanus today.  HIV screening- will update HIV screening today.   This visit occurred during the SARS-CoV-2 public health emergency.  Safety protocols were in place, including screening questions prior to the visit, additional usage of staff PPE, and extensive cleaning of exam room while observing appropriate contact time as indicated for disinfecting solutions.

## 2019-06-15 ENCOUNTER — Telehealth: Payer: Self-pay | Admitting: Family

## 2019-06-15 LAB — HIV ANTIBODY (ROUTINE TESTING W REFLEX): HIV 1&2 Ab, 4th Generation: NONREACTIVE

## 2019-06-15 NOTE — Telephone Encounter (Signed)
Copied from CRM [id number]. Topic: General - Inquiry >> Jun 15, 2019  1:53 PM Deborha Payment wrote: Reason for CRM: Patient is requesting a call back regarding medication atorvastatin (LIPITOR) 10 MG tablet  She wants to know if she needs to be taking medication, since recent labs were good. Call back [phone]

## 2019-06-16 NOTE — Telephone Encounter (Signed)
Even though labs look good, the atorvastatin will lower her future risk of heart attack and stroke so I think she should take it.

## 2019-07-22 LAB — HM DIABETES EYE EXAM

## 2019-09-13 ENCOUNTER — Other Ambulatory Visit: Payer: Self-pay

## 2019-09-13 ENCOUNTER — Ambulatory Visit: Payer: 59 | Admitting: Family

## 2019-09-13 ENCOUNTER — Other Ambulatory Visit: Payer: 59

## 2019-09-13 VITALS — BP 138/87 | HR 93 | Temp 97.8°F | Resp 16 | Ht 59.0 in | Wt 147.0 lb

## 2019-09-13 DIAGNOSIS — R059 Cough, unspecified: Secondary | ICD-10-CM

## 2019-09-13 DIAGNOSIS — E785 Hyperlipidemia, unspecified: Secondary | ICD-10-CM

## 2019-09-13 DIAGNOSIS — E119 Type 2 diabetes mellitus without complications: Secondary | ICD-10-CM | POA: Diagnosis not present

## 2019-09-13 DIAGNOSIS — H6691 Otitis media, unspecified, right ear: Secondary | ICD-10-CM | POA: Diagnosis not present

## 2019-09-13 DIAGNOSIS — R05 Cough: Secondary | ICD-10-CM

## 2019-09-13 MED ORDER — AMOXICILLIN 500 MG PO CAPS: 500.0000 mg | ORAL_CAPSULE | Freq: Three times a day (TID) | ORAL | 0 refills | Status: DC

## 2019-09-13 NOTE — Patient Instructions (Addendum)
1.  text "covid" to [phone]    or   2.  Schedule at [email or web address]   or    3.  Call [phone]   Start amoxicillin for right sided ear infection. Call if symptoms worsen or fail to improve. Schedule covid-19 testing.  Please remain at home and out of work until we can review your covid test results.

## 2019-09-13 NOTE — Progress Notes (Signed)
Subjective:     Patient ID: Rose Hansen, female   DOB: 05/23/1976, 44 y.o.   MRN: [id number]  HPI   Patient is a 44 yr old female who presents today for follow up.  She does report that she is getting a "summer cold."    DM2- sugars are in the 90's. Trying to lose weight. Lab Results  Component Value Date   HGBA1C 6.0 06/14/2019   HGBA1C 6.5 01/04/2019   HGBA1C 6.3 06/22/2018   Lab Results  Component Value Date   MICROALBUR 3.5 (H) 06/14/2019   LDLCALC 99 06/14/2019   CREATININE 0.84 06/14/2019    Lab Results  Component Value Date   HGBA1C 6.0 06/14/2019   HGBA1C 6.5 01/04/2019   HGBA1C 6.3 06/22/2018   Lab Results  Component Value Date   MICROALBUR 3.5 (H) 06/14/2019   LDLCALC 99 06/14/2019   CREATININE 0.84 06/14/2019   Reports that she developed a sore throat 13-Oct-2022 AM. Developed a cough and hoarseness on Monday.  Sore throat persists. Denies HA, or nasal drainage. Denies loss of taste or smell.   Review of Systems    see HPI  Past Medical History:  Diagnosis Date  . Abnormal glucose   . Circadian rhythm sleep disorder, shift work type   . Diabetes type 2, controlled ([place]) 08/15/2016  . Fatty liver 08/22/2016  . Fibromyalgia   . History of UTI   . Mild anemia   . Nonspecific reaction to tuberculin skin test without active tuberculosis(795.51)      Social History   Socioeconomic History  . Marital status: Single    Spouse name: Not on file  . Number of children: 0  . Years of education: Not on file  . Highest education level: Not on file  Occupational History  . Occupation: dialysis nurse at [employer]  . Smoking status: Never Smoker  . Smokeless tobacco: Never Used  Substance and Sexual Activity  . Alcohol use: No  . Drug use: No  . Sexual activity: Not on file  Other Topics Concern  . Not on file  Social History Narrative   Mom- alzheimers- passed away 12-Oct-2017   Single   Dialysis RN- clinical coordinator (works days)     Completed college   No children   Enjoys reading and spending time with nieces and nephews   Social Determinants of Radio broadcast assistant Strain:   . Difficulty of Paying Living Expenses:   Food Insecurity:   . Worried About Charity fundraiser in the Last Year:   . Arboriculturist in the Last Year:   Transportation Needs:   . Film/video editor (Medical):   Marland Kitchen Lack of Transportation (Non-Medical):   Physical Activity:   . Days of Exercise per Week:   . Minutes of Exercise per Session:   Stress:   . Feeling of Stress :   Social Connections:   . Frequency of Communication with Friends and Family:   . Frequency of Social Gatherings with Friends and Family:   . Attends Religious Services:   . Active Member of Clubs or Organizations:   . Attends Archivist Meetings:   Marland Kitchen Marital Status:   Intimate Partner Violence:   . Fear of Current or Ex-Partner:   . Emotionally Abused:   Marland Kitchen Physically Abused:   . Sexually Abused:     Past Surgical History:  Procedure Laterality Date  . gyn surgery  08/2007  Dr. Jennette Kettle for benign cystic teratomas of right ovary,uterine fibroids, follicular cyst of left ovary    Family History  Problem Relation Age of Onset  . Hypertension Father   . Colon polyps Father   . Alzheimer's disease Mother 42       end stage dementia  . Asthma Sister   . Colon cancer Maternal Grandfather 45    Allergies  Allergen Reactions  . Pylera [Bis Subcit-Metronid-Tetracyc] Swelling    Swelling of face, eyes, lips.  . Bismuth-Containing Compounds Swelling  . Metronidazole Swelling  . Tetracyclines & Related Swelling    Current Outpatient Medications on File Prior to Visit  Medication Sig Dispense Refill  . atorvastatin (LIPITOR) 10 MG tablet Take 1 tablet (10 mg total) by mouth daily. 90 tablet 1   Current Facility-Administered Medications on File Prior to Visit  Medication Dose Route Frequency Provider Last Rate Last Admin  . 0.9 %  sodium  chloride infusion  500 mL Intravenous Continuous Danis, Starr Lake III, MD        BP 138/87 (BP Location: Right Arm, Patient Position: Sitting, Cuff Size: Small)   Pulse 93   Temp 97.8 F (36.6 C) (Temporal)   Resp 16   Ht 4\' 11"  (1.499 m)   Wt 147 lb (66.7 kg)   SpO2 100%   BMI 29.69 kg/m    Objective:   Physical Exam Constitutional:      Appearance: She is well-developed.  HENT:     Left Ear: Ear canal normal. Tympanic membrane is erythematous and retracted. Tympanic membrane is not perforated.     Mouth/Throat:     Mouth: Mucous membranes are moist.     Pharynx: No posterior oropharyngeal erythema.  Neck:     Thyroid: No thyromegaly.  Cardiovascular:     Rate and Rhythm: Normal rate and regular rhythm.     Heart sounds: Normal heart sounds. No murmur.  Pulmonary:     Effort: Pulmonary effort is normal. No respiratory distress.     Breath sounds: Normal breath sounds. No wheezing.  Musculoskeletal:     Cervical back: Neck supple.  Skin:    General: Skin is warm and dry.  Neurological:     Mental Status: She is alert and oriented to person, place, and time.  Psychiatric:        Behavior: Behavior normal.        Thought Content: Thought content normal.        Judgment: Judgment normal.        Assessment:    Right Otitis Media- will rx with amoxicillin. I did advise pt to quarantine at home and complete covid-19 testing.  Call if symptoms worsen or fail to improve.  DM2- clinically stable, obtain follow up A1C. Continue weight loss efforts  Hyperlipidemia- tolerating statin, obtain follow up Lipid panel.   This visit occurred during the SARS-CoV-2 public health emergency.  Safety protocols were in place, including screening questions prior to the visit, additional usage of staff PPE, and extensive cleaning of exam room while observing appropriate contact time as indicated for disinfecting solutions.     Plan:

## 2019-09-15 ENCOUNTER — Encounter: Payer: Self-pay | Admitting: Family

## 2019-09-27 ENCOUNTER — Other Ambulatory Visit: Payer: Self-pay

## 2019-09-27 ENCOUNTER — Other Ambulatory Visit (INDEPENDENT_AMBULATORY_CARE_PROVIDER_SITE_OTHER): Payer: 59

## 2019-09-27 DIAGNOSIS — E119 Type 2 diabetes mellitus without complications: Secondary | ICD-10-CM

## 2019-09-27 DIAGNOSIS — E785 Hyperlipidemia, unspecified: Secondary | ICD-10-CM | POA: Diagnosis not present

## 2019-09-27 LAB — COMPREHENSIVE METABOLIC PANEL
ALT: 12 U/L (ref 0–35)
AST: 14 U/L (ref 0–37)
Albumin: 4.4 g/dL (ref 3.5–5.2)
Alkaline Phosphatase: 83 U/L (ref 39–117)
BUN: 10 mg/dL (ref 6–23)
CO2: 28 mEq/L (ref 19–32)
Calcium: 9.3 mg/dL (ref 8.4–10.5)
Chloride: 104 mEq/L (ref 96–112)
Creatinine, Ser: 0.75 mg/dL (ref 0.40–1.20)
GFR: 101.82 mL/min (ref >60.00)
Glucose, Bld: 100 mg/dL — ABNORMAL HIGH (ref 70–99)
Potassium: 3.8 mEq/L (ref 3.5–5.1)
Sodium: 140 mEq/L (ref 135–145)
Total Bilirubin: 1 mg/dL (ref 0.2–1.2)
Total Protein: 6.9 g/dL (ref 6.0–8.3)

## 2019-09-27 LAB — HEMOGLOBIN A1C: Hgb A1c MFr Bld: 5.8 % (ref 4.6–6.5)

## 2019-09-27 LAB — LIPID PANEL
Cholesterol: 98 mg/dL (ref 0–200)
HDL: 45.2 mg/dL (ref >39.00)
LDL Cholesterol: 45 mg/dL (ref 0–99)
NonHDL: 52.9
Total CHOL/HDL Ratio: 2
Triglycerides: 42 mg/dL (ref 0.0–149.0)
VLDL: 8.4 mg/dL (ref 0.0–40.0)

## 2019-09-29 ENCOUNTER — Other Ambulatory Visit: Payer: 59

## 2019-12-08 ENCOUNTER — Other Ambulatory Visit: Payer: Self-pay | Admitting: Family

## 2020-01-17 ENCOUNTER — Ambulatory Visit: Payer: 59 | Admitting: Family

## 2020-01-17 ENCOUNTER — Ambulatory Visit (HOSPITAL_BASED_OUTPATIENT_CLINIC_OR_DEPARTMENT_OTHER)
Admission: RE | Admit: 2020-01-17 | Discharge: 2020-01-17 | Disposition: A | Discharge status: Home / Self Care | Payer: 59 | Source: Ambulatory Visit | Attending: Family | Admitting: Family

## 2020-01-17 ENCOUNTER — Other Ambulatory Visit: Payer: Self-pay

## 2020-01-17 ENCOUNTER — Encounter: Payer: Self-pay | Admitting: Family

## 2020-01-17 VITALS — BP 143/88 | HR 68 | Temp 98.5°F | Resp 16 | Ht 59.0 in | Wt 133.0 lb

## 2020-01-17 DIAGNOSIS — R030 Elevated blood-pressure reading, without diagnosis of hypertension: Secondary | ICD-10-CM | POA: Diagnosis not present

## 2020-01-17 DIAGNOSIS — R7611 Nonspecific reaction to tuberculin skin test without active tuberculosis: Secondary | ICD-10-CM | POA: Insufficient documentation

## 2020-01-17 DIAGNOSIS — E785 Hyperlipidemia, unspecified: Secondary | ICD-10-CM | POA: Diagnosis not present

## 2020-01-17 DIAGNOSIS — E119 Type 2 diabetes mellitus without complications: Secondary | ICD-10-CM

## 2020-01-17 MED ORDER — ATORVASTATIN CALCIUM 10 MG PO TABS: 10.0000 mg | ORAL_TABLET | Freq: Every day | ORAL | 1 refills | Status: DC

## 2020-01-17 NOTE — Progress Notes (Signed)
Subjective:    Patient ID: Rose Hansen, female    DOB: 10-02-1975, 44 y.o.   MRN: [id number]  HPI  Patient is a 44 yr old female who presents today for follow up. She continues to work hard on [employer], exercise and weight loss and has lost 50 pounds.  She states she is feeling much better and is very pleased with this.   DM2- Sugars have been in the 90's.  Wt Readings from Last 3 Encounters:  01/17/20 133 lb (60.3 kg)  09/13/19 147 lb (66.7 kg)  06/14/19 157 lb (71.2 kg)     Lab Results  Component Value Date   HGBA1C 5.8 09/27/2019    BP Readings from Last 3 Encounters:  01/17/20 (!) 143/88  09/13/19 138/87  06/14/19 130/83   Hyperlipidemia- maintained on lipitor. Tolerating without difficulty.   Lab Results  Component Value Date   CHOL 98 09/27/2019   HDL 45.20 09/27/2019   LDLCALC 45 09/27/2019   TRIG 42.0 09/27/2019   CHOLHDL 2 09/27/2019     Review of Systems Past Medical History:  Diagnosis Date   Abnormal glucose    Circadian rhythm sleep disorder, shift work type    Diabetes type 2, controlled (HCC) 08/15/2016   Fatty liver 08/22/2016   Fibromyalgia    History of UTI    Mild anemia    Nonspecific reaction to tuberculin skin test without active tuberculosis(795.51)      Social History   Socioeconomic History   Marital status: Single    Spouse name: Not on file   Number of children: 0   Years of education: Not on file   Highest education level: Not on file  Occupational History   Occupation: dialysis nurse at [employer]  Tobacco Use   Smoking status: Never Smoker   Smokeless tobacco: Never Used  Substance and Sexual Activity   Alcohol use: No   Drug use: No   Sexual activity: Not on file  Other Topics Concern   Not on file  Social History Narrative   Mom- alzheimers- passed away 09-22-17   Single   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)    Completed college   No children   Enjoys reading and spending  time with nieces and nephews   Social Determinants of Corporate investment banker Strain:    Difficulty of Paying Living Expenses:   Food Insecurity:    Worried About Programme researcher, broadcasting/film/video in the Last Year:    Barista in the Last Year:   Transportation Needs:    Freight forwarder (Medical):    Lack of Transportation (Non-Medical):   Physical Activity:    Days of Exercise per Week:    Minutes of Exercise per Session:   Stress:    Feeling of Stress :   Social Connections:    Frequency of Communication with Friends and Family:    Frequency of Social Gatherings with Friends and Family:    Attends Religious Services:    Active Member of Clubs or Organizations:    Attends Banker Meetings:    Marital Status:   Intimate Partner Violence:    Fear of Current or Ex-Partner:    Emotionally Abused:    Physically Abused:    Sexually Abused:     Past Surgical History:  Procedure Laterality Date   gyn surgery  08/2007   Dr. Jennette Kettle for benign cystic teratomas of right ovary,uterine fibroids, follicular cyst of  left ovary    Family History  Problem Relation Age of Onset   Hypertension Father    Colon polyps Father    Alzheimer's disease Mother 72       end stage dementia   Asthma Sister    Colon cancer Maternal Grandfather 75    Allergies  Allergen Reactions   Pylera [Bis Subcit-Metronid-Tetracyc] Swelling    Swelling of face, eyes, lips.   Bismuth-Containing Compounds Swelling   Metronidazole Swelling   Tetracyclines & Related Swelling    No current outpatient medications on file prior to visit.   Current Facility-Administered Medications on File Prior to Visit  Medication Dose Route Frequency Provider Last Rate Last Admin   0.9 %  sodium chloride infusion  500 mL Intravenous Continuous Danis, Starr Lake III, MD        BP (!) 143/88 (BP Location: Right Arm, Patient Position: Sitting, Cuff Size: Small)    Pulse 68    Temp  98.5 F (36.9 C) (Oral)    Resp 16    Ht 4\' 11"  (1.499 m)    Wt 133 lb (60.3 kg)    LMP 11/22/2018    SpO2 100%    BMI 26.86 kg/m       Objective:   Physical Exam Constitutional:      Appearance: She is well-developed.  Cardiovascular:     Rate and Rhythm: Normal rate and regular rhythm.     Heart sounds: Normal heart sounds. No murmur heard.   Pulmonary:     Effort: Pulmonary effort is normal. No respiratory distress.     Breath sounds: Normal breath sounds. No wheezing.  Psychiatric:        Behavior: Behavior normal.        Thought Content: Thought content normal.        Judgment: Judgment normal.           Assessment & Plan:  DM2- sugars are well controlled following her weight loss.  Encouraged her to keep up her good work.  Elevated blood pressure reading- Will continue to monitor.  Hyperlipidemia- LDL at goal, continue current dose of lipitor.    This visit occurred during the SARS-CoV-2 public health emergency.  Safety protocols were in place, including screening questions prior to the visit, additional usage of staff PPE, and extensive cleaning of exam room while observing appropriate contact time as indicated for disinfecting solutions.

## 2020-01-26 LAB — HM PAP SMEAR: HM Pap smear: NORMAL

## 2020-01-27 LAB — HM MAMMOGRAPHY

## 2020-02-14 ENCOUNTER — Encounter: Payer: 59 | Admitting: Family

## 2020-02-20 ENCOUNTER — Encounter: Payer: Self-pay | Admitting: Family

## 2020-02-20 ENCOUNTER — Ambulatory Visit: Payer: 59 | Admitting: Family

## 2020-02-20 ENCOUNTER — Other Ambulatory Visit: Payer: Self-pay

## 2020-02-20 VITALS — BP 137/78 | HR 95 | Temp 98.6°F | Resp 16 | Ht 59.5 in | Wt 135.0 lb

## 2020-02-20 DIAGNOSIS — Z0000 Encounter for general adult medical examination without abnormal findings: Secondary | ICD-10-CM | POA: Diagnosis not present

## 2020-02-20 DIAGNOSIS — E1169 Type 2 diabetes mellitus with other specified complication: Secondary | ICD-10-CM

## 2020-02-20 DIAGNOSIS — E785 Hyperlipidemia, unspecified: Secondary | ICD-10-CM

## 2020-02-20 DIAGNOSIS — E119 Type 2 diabetes mellitus without complications: Secondary | ICD-10-CM

## 2020-02-20 DIAGNOSIS — Z23 Encounter for immunization: Secondary | ICD-10-CM | POA: Diagnosis not present

## 2020-02-20 NOTE — Patient Instructions (Addendum)
Please complete lab work prior to leaving.    Preventive Care 40-44 Years Old, Female Preventive care refers to visits with your health care provider and lifestyle choices that can promote health and wellness. This includes:  A yearly physical exam. This may also be called an annual well check.  Regular dental visits and eye exams.  Immunizations.  Screening for certain conditions.  Healthy lifestyle choices, such as eating a healthy diet, getting regular exercise, not using drugs or products that contain nicotine and tobacco, and limiting alcohol use. What can I expect for my preventive care visit? Physical exam Your health care provider will check your:  Height and weight. This may be used to calculate body mass index (BMI), which tells if you are at a healthy weight.  Heart rate and blood pressure.  Skin for abnormal spots. Counseling Your health care provider may ask you questions about your:  Alcohol, tobacco, and drug use.  Emotional well-being.  Home and relationship well-being.  Sexual activity.  Eating habits.  Work and work environment.  Method of birth control.  Menstrual cycle.  Pregnancy history. What immunizations do I need?  Influenza (flu) vaccine  This is recommended every year. Tetanus, diphtheria, and pertussis (Tdap) vaccine  You may need a Td booster every 10 years. Varicella (chickenpox) vaccine  You may need this if you have not been vaccinated. Zoster (shingles) vaccine  You may need this after age 60. Measles, mumps, and rubella (MMR) vaccine  You may need at least one dose of MMR if you were born in 1957 or later. You may also need a second dose. Pneumococcal conjugate (PCV13) vaccine  You may need this if you have certain conditions and were not previously vaccinated. Pneumococcal polysaccharide (PPSV23) vaccine  You may need one or two doses if you smoke cigarettes or if you have certain conditions. Meningococcal conjugate  (MenACWY) vaccine  You may need this if you have certain conditions. Hepatitis A vaccine  You may need this if you have certain conditions or if you travel or work in places where you may be exposed to hepatitis A. Hepatitis B vaccine  You may need this if you have certain conditions or if you travel or work in places where you may be exposed to hepatitis B. Haemophilus influenzae type b (Hib) vaccine  You may need this if you have certain conditions. Human papillomavirus (HPV) vaccine  If recommended by your health care provider, you may need three doses over 6 months. You may receive vaccines as individual doses or as more than one vaccine together in one shot (combination vaccines). Talk with your health care provider about the risks and benefits of combination vaccines. What tests do I need? Blood tests  Lipid and cholesterol levels. These may be checked every 5 years, or more frequently if you are over 50 years old.  Hepatitis C test.  Hepatitis B test. Screening  Lung cancer screening. You may have this screening every year starting at age 55 if you have a 30-pack-year history of smoking and currently smoke or have quit within the past 15 years.  Colorectal cancer screening. All adults should have this screening starting at age 50 and continuing until age 75. Your health care provider may recommend screening at age 45 if you are at increased risk. You will have tests every 1-10 years, depending on your results and the type of screening test.  Diabetes screening. This is done by checking your blood sugar (glucose) after you have   not eaten for a while (fasting). You may have this done every 1-3 years.  Mammogram. This may be done every 1-2 years. Talk with your health care provider about when you should start having regular mammograms. This may depend on whether you have a family history of breast cancer.  BRCA-related cancer screening. This may be done if you have a family  history of breast, ovarian, tubal, or peritoneal cancers.  Pelvic exam and Pap test. This may be done every 3 years starting at age 21. Starting at age 30, this may be done every 5 years if you have a Pap test in combination with an HPV test. Other tests  Sexually transmitted disease (STD) testing.  Bone density scan. This is done to screen for osteoporosis. You may have this scan if you are at high risk for osteoporosis. Follow these instructions at home: Eating and drinking  Eat a diet that includes fresh fruits and vegetables, whole grains, lean protein, and low-fat dairy.  Take vitamin and mineral supplements as recommended by your health care provider.  Do not drink alcohol if: ? Your health care provider tells you not to drink. ? You are pregnant, may be pregnant, or are planning to become pregnant.  If you drink alcohol: ? Limit how much you have to 0-1 drink a day. ? Be aware of how much alcohol is in your drink. In the [place] one drink equals one 12 oz bottle of beer (355 mL), one 5 oz glass of wine (148 mL), or one 1 oz glass of hard liquor (44 mL). Lifestyle  Take daily care of your teeth and gums.  Stay active. Exercise for at least 30 minutes on 5 or more days each week.  Do not use any products that contain nicotine or tobacco, such as cigarettes, e-cigarettes, and chewing tobacco. If you need help quitting, ask your health care provider.  If you are sexually active, practice safe sex. Use a condom or other form of birth control (contraception) in order to prevent pregnancy and STIs (sexually transmitted infections).  If told by your health care provider, take low-dose aspirin daily starting at age 50. What's next?  Visit your health care provider once a year for a well check visit.  Ask your health care provider how often you should have your eyes and teeth checked.  Stay up to date on all vaccines. This information is not intended to replace advice given to you  by your health care provider. Make sure you discuss any questions you have with your health care provider. Document Revised: 01/28/2018 Document Reviewed: 01/28/2018 Elsevier Patient Education  2020 Elsevier Inc.  

## 2020-02-20 NOTE — Progress Notes (Signed)
Subjective:    Patient ID: Rose Hansen, female    DOB: 1975/09/14, 44 y.o.   MRN: [id number]  HPI  Patient is a 44 yr old female who presents today for cpx.  Immunizations:  Completed pfizer series, pneumovax, Tdap 1/21.  Diet:  Reports healthy diet Exercise: some Pap Smear:01/26/20 Mammogram: 01/25/20 Vision: 2/21 Dental:  Up to date   Wt Readings from Last 3 Encounters:  02/20/20 135 lb (61.2 kg)  01/17/20 133 lb (60.3 kg)  09/13/19 147 lb (66.7 kg)   Reports intermittent voice hoarseness x 1 week.   Review of Systems  Constitutional: Negative for unexpected weight change.  HENT: Negative for hearing loss and rhinorrhea.   Eyes: Negative for visual disturbance.  Respiratory: Negative for cough.   Cardiovascular: Negative for leg swelling.  Gastrointestinal: Negative for constipation and diarrhea.  Genitourinary: Negative for dysuria and frequency.  Musculoskeletal: Negative for arthralgias and myalgias.  Skin: Negative for rash.  Neurological: Negative for headaches.  Hematological: Negative for adenopathy.  Psychiatric/Behavioral:       Denies depression/anxiety   Past Medical History:  Diagnosis Date  . Abnormal glucose   . Circadian rhythm sleep disorder, shift work type   . Diabetes type 2, controlled ([facility]) 08/15/2016  . Fatty liver 08/22/2016  . Fibromyalgia   . History of UTI   . Mild anemia   . Nonspecific reaction to tuberculin skin test without active tuberculosis(795.51)      Social History   Socioeconomic History  . Marital status: Single    Spouse name: Not on file  . Number of children: 0  . Years of education: Not on file  . Highest education level: Not on file  Occupational History  . Occupation: dialysis nurse at [employer]  . Smoking status: Never Smoker  . Smokeless tobacco: Never Used  Substance and Sexual Activity  . Alcohol use: No  . Drug use: No  . Sexual activity: Not on file  Other Topics Concern  . Not  on file  Social History Narrative   Mom- alzheimers- passed away 01-Oct-2017   Single   Dialysis RN- clinical coordinator (works days)    Completed college   No children   Enjoys reading and spending time with nieces and nephews   Social Determinants of Corporate investment banker Strain:   . Difficulty of Paying Living Expenses: Not on file  Food Insecurity:   . Worried About Programme researcher, broadcasting/film/video in the Last Year: Not on file  . Ran Out of Food in the Last Year: Not on file  Transportation Needs:   . Lack of Transportation (Medical): Not on file  . Lack of Transportation (Non-Medical): Not on file  Physical Activity:   . Days of Exercise per Week: Not on file  . Minutes of Exercise per Session: Not on file  Stress:   . Feeling of Stress : Not on file  Social Connections:   . Frequency of Communication with Friends and Family: Not on file  . Frequency of Social Gatherings with Friends and Family: Not on file  . Attends Religious Services: Not on file  . Active Member of Clubs or Organizations: Not on file  . Attends Banker Meetings: Not on file  . Marital Status: Not on file  Intimate Partner Violence:   . Fear of Current or Ex-Partner: Not on file  . Emotionally Abused: Not on file  . Physically Abused: Not on file  .  Sexually Abused: Not on file    Past Surgical History:  Procedure Laterality Date  . ABDOMINAL HYSTERECTOMY N/A 12/07/2018  . gyn surgery  08/2007   Dr. Jennette Kettle for benign cystic teratomas of right ovary,uterine fibroids, follicular cyst of left ovary    Family History  Problem Relation Age of Onset  . Hypertension Father   . Colon polyps Father   . Alzheimer's disease Mother 7       end stage dementia  . Asthma Sister   . Colon cancer Maternal Grandfather 45    Allergies  Allergen Reactions  . Pylera [Bis Subcit-Metronid-Tetracyc] Swelling    Swelling of face, eyes, lips.  . Bismuth-Containing Compounds Swelling  . Metronidazole Swelling   . Tetracyclines & Related Swelling    Current Outpatient Medications on File Prior to Visit  Medication Sig Dispense Refill  . atorvastatin (LIPITOR) 10 MG tablet Take 1 tablet (10 mg total) by mouth daily. 90 tablet 1   No current facility-administered medications on file prior to visit.    BP 137/78 (BP Location: Right Arm, Patient Position: Sitting, Cuff Size: Small)   Pulse 95   Temp 98.6 F (37 C) (Oral)   Resp 16   Ht 4' 11.5" (1.511 m)   Wt 135 lb (61.2 kg)   LMP 11/22/2018   SpO2 100%   BMI 26.81 kg/m       Objective:   Physical Exam Physical Exam  Constitutional: She is oriented to person, place, and time. She appears well-developed and well-nourished. No distress.  HENT:  Head: Normocephalic and atraumatic.  Right Ear: Tympanic membrane and ear canal normal.  Left Ear: Tympanic membrane and ear canal normal.  Mouth/Throat: Oropharynx is clear and moist.  Eyes: Pupils are equal, round, and reactive to light. No scleral icterus.  Neck: Normal range of motion. No thyromegaly present.  Cardiovascular: Normal rate and regular rhythm.   No murmur heard. Pulmonary/Chest: Effort normal and breath sounds normal. No respiratory distress. He has no wheezes. She has no rales. She exhibits no tenderness.  Abdominal: Soft. Bowel sounds are normal. She exhibits no distension and no mass. There is no tenderness. There is no rebound and no guarding.  Musculoskeletal: She exhibits no edema.  Lymphadenopathy:    She has no cervical adenopathy.  Neurological: She is alert and oriented to person, place, and time. She has normal patellar reflexes. She exhibits normal muscle tone. Coordination normal.  Skin: Skin is warm and dry.  Psychiatric: She has a normal mood and affect. Her behavior is normal. Judgment and thought content normal.     Breast/pelvic: deferred. Assessment & Plan:    Preventative care- discussed healthy diet, exercise. She is approaching her goal weight.  Pap/mammo/up to date.  Flu shot today. Obtain labs as ordered.  DM2- obtain A1C.  Hyperlipidemia- obtain follow up lipid panel.  This visit occurred during the SARS-CoV-2 public health emergency.  Safety protocols were in place, including screening questions prior to the visit, additional usage of staff PPE, and extensive cleaning of exam room while observing appropriate contact time as indicated for disinfecting solutions.         Assessment & Plan:

## 2020-02-21 LAB — LIPID PANEL
Cholesterol: 117 mg/dL (ref <200)
HDL: 58 mg/dL (ref >=50)
LDL Cholesterol (Calc): 46 mg/dL (calc)
Non-HDL Cholesterol (Calc): 59 mg/dL (calc) (ref <130)
Total CHOL/HDL Ratio: 2 (calc) (ref <5.0)
Triglycerides: 46 mg/dL (ref <150)

## 2020-02-21 LAB — COMPREHENSIVE METABOLIC PANEL
AG Ratio: 1.7 (calc) (ref 1.0–2.5)
ALT: 13 U/L (ref 6–29)
AST: 14 U/L (ref 10–30)
Albumin: 4.5 g/dL (ref 3.6–5.1)
Alkaline phosphatase (APISO): 83 U/L (ref 31–125)
BUN: 12 mg/dL (ref 7–25)
CO2: 30 mmol/L (ref 20–32)
Calcium: 9.8 mg/dL (ref 8.6–10.2)
Chloride: 105 mmol/L (ref 98–110)
Creat: 0.8 mg/dL (ref 0.50–1.10)
Globulin: 2.7 g/dL (calc) (ref 1.9–3.7)
Glucose, Bld: 100 mg/dL — ABNORMAL HIGH (ref 65–99)
Potassium: 4.3 mmol/L (ref 3.5–5.3)
Sodium: 141 mmol/L (ref 135–146)
Total Bilirubin: 0.7 mg/dL (ref 0.2–1.2)
Total Protein: 7.2 g/dL (ref 6.1–8.1)

## 2020-02-21 LAB — HEMOGLOBIN A1C
Hgb A1c MFr Bld: 5.6 % of total Hgb (ref <5.7)
Mean Plasma Glucose: 114 (calc)
eAG (mmol/L): 6.3 (calc)

## 2020-07-24 LAB — HM DIABETES EYE EXAM

## 2020-08-11 ENCOUNTER — Other Ambulatory Visit: Payer: Self-pay | Admitting: Family

## 2020-08-21 ENCOUNTER — Encounter: Payer: Self-pay | Admitting: Family

## 2020-08-21 ENCOUNTER — Other Ambulatory Visit: Payer: Self-pay

## 2020-08-21 ENCOUNTER — Ambulatory Visit (HOSPITAL_BASED_OUTPATIENT_CLINIC_OR_DEPARTMENT_OTHER)
Admission: RE | Admit: 2020-08-21 | Discharge: 2020-08-21 | Disposition: A | Discharge status: Home / Self Care | Payer: 59 | Source: Ambulatory Visit | Attending: Family | Admitting: Family

## 2020-08-21 ENCOUNTER — Ambulatory Visit: Payer: 59 | Admitting: Family

## 2020-08-21 VITALS — BP 120/80 | HR 80 | Temp 98.7°F | Resp 16 | Ht 59.5 in | Wt 152.6 lb

## 2020-08-21 DIAGNOSIS — R7611 Nonspecific reaction to tuberculin skin test without active tuberculosis: Secondary | ICD-10-CM

## 2020-08-21 DIAGNOSIS — E119 Type 2 diabetes mellitus without complications: Secondary | ICD-10-CM | POA: Diagnosis not present

## 2020-08-21 DIAGNOSIS — E785 Hyperlipidemia, unspecified: Secondary | ICD-10-CM

## 2020-08-21 LAB — BASIC METABOLIC PANEL
BUN: 11 mg/dL (ref 6–23)
CO2: 30 mEq/L (ref 19–32)
Calcium: 9.8 mg/dL (ref 8.4–10.5)
Chloride: 105 mEq/L (ref 96–112)
Creatinine, Ser: 0.8 mg/dL (ref 0.40–1.20)
GFR: 89.63 mL/min (ref >60.00)
Glucose, Bld: 116 mg/dL — ABNORMAL HIGH (ref 70–99)
Potassium: 4.3 mEq/L (ref 3.5–5.1)
Sodium: 140 mEq/L (ref 135–145)

## 2020-08-21 LAB — MICROALBUMIN / CREATININE URINE RATIO
Creatinine,U: 54.7 mg/dL
Microalb Creat Ratio: 1.3 mg/g (ref 0.0–30.0)
Microalb, Ur: <0.7 mg/dL (ref 0.0–1.9)

## 2020-08-21 LAB — HEMOGLOBIN A1C: Hgb A1c MFr Bld: 5.8 % (ref 4.6–6.5)

## 2020-08-21 NOTE — Patient Instructions (Signed)
Please complete lab work prior to leaving.   

## 2020-08-21 NOTE — Progress Notes (Signed)
Subjective:    Patient ID: Rose Hansen, female    DOB: 02-02-1976, 45 y.o.   MRN: [id number]  HPI  Patient is a 45 yr old female who presents today for follow up.  DM2- diet controlled. 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 As a result, she has gained some weight.   Wt Readings from Last 3 Encounters:  08/21/20 152 lb 9.6 oz (69.2 kg)  02/20/20 135 lb (61.2 kg)  01/17/20 133 lb (60.3 kg)    Lab Results  Component Value Date   HGBA1C 5.6 02/20/2020   HGBA1C 5.8 09/27/2019   HGBA1C 6.0 06/14/2019   Lab Results  Component Value Date   MICROALBUR 3.5 (H) 06/14/2019   LDLCALC 46 02/20/2020   CREATININE 0.80 02/20/2020   Hyperlipidemia- maintained on atorvastatin 10mg .  Lab Results  Component Value Date   CHOL 117 02/20/2020   HDL 58 02/20/2020   LDLCALC 46 02/20/2020   TRIG 46 02/20/2020   CHOLHDL 2.0 02/20/2020    Hx of PPD + requesting order for her annual chest x-ray.    Review of Systems See HPI  Past Medical History:  Diagnosis Date  . Abnormal glucose   . Circadian rhythm sleep disorder, shift work type   . Diabetes type 2, controlled (HCC) 08/15/2016  . Fatty liver 08/22/2016  . Fibromyalgia   . History of UTI   . Mild anemia   . Nonspecific reaction to tuberculin skin test without active tuberculosis(795.51)      Social History   Socioeconomic History  . Marital status: Single    Spouse name: Not on file  . Number of children: 0  . Years of education: Not on file  . Highest education level: Not on file  Occupational History  . Occupation: dialysis nurse at [employer]  . Smoking status: Never Smoker  . Smokeless tobacco: Never Used  Substance and Sexual Activity  . Alcohol use: No  . Drug use: No  . Sexual activity: Not on file  Other Topics Concern  . 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 ABDOMINAL HYSTERECTOMY N/A 12/07/2018  . gyn surgery  08/2007   Dr. 09/2007 for benign cystic teratomas of right ovary,uterine fibroids, follicular cyst of left ovary    Family History  Problem Relation Age of Onset  . Hypertension Father        (father was adopted)  . Colon polyps Father   . Alzheimer's disease Mother 35       end stage dementia  . Asthma Sister   . Hypertension Sister   . Colon cancer Maternal Grandfather 45  . Diabetes Mellitus II Maternal Grandfather   . Diabetes Mellitus II Maternal Grandmother   . CVA Maternal Grandmother     Allergies  Allergen Reactions  . Pylera [Bis Subcit-Metronid-Tetracyc] Swelling  Swelling of face, eyes, lips.  . Bismuth-Containing Compounds Swelling  . Metronidazole Swelling  . Tetracyclines & Related Swelling    Current Outpatient Medications on File Prior to Visit  Medication Sig Dispense Refill  . atorvastatin (LIPITOR) 10 MG tablet TAKE 1 TABLET BY MOUTH EVERY DAY 90 tablet 1   No current facility-administered medications on file prior to visit.    BP 120/80 (BP Location: Right Arm, Patient Position: Sitting, Cuff Size: Small)   Pulse 80   Temp 98.7 F (37.1 C) (Oral)   Resp 16   Ht 4' 11.5" (1.511 m)   Wt 152 lb 9.6 oz (69.2 kg)   LMP 11/22/2018   SpO2 100%   BMI 30.31 kg/m       Objective:   Physical Exam Constitutional:      Appearance: She is well-developed.  Cardiovascular:     Rate and Rhythm: Normal rate and regular rhythm.     Heart sounds: Normal heart sounds. No  murmur heard.   Pulmonary:     Effort: Pulmonary effort is normal. No respiratory distress.     Breath sounds: Normal breath sounds. No wheezing.  Psychiatric:        Behavior: Behavior normal.        Thought Content: Thought content normal.        Judgment: Judgment normal.           Assessment & Plan:  DM2- diet controlled. Expect A1C to be higher this time in the setting of weight gain. 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 We discussed that if her urine microalbumin is still elevated that we should put her on a low dose ace inhibitor.  Hyperlipidemia- tolerating statin.  LDL at goal. Continue atorvastatin 10mg  once daily.   PPD positive- check annual chest x-ray.   This visit occurred during the SARS-CoV-2 public health emergency.  Safety protocols were in place, including screening questions prior to the visit, additional usage of staff PPE, and extensive cleaning of exam room while observing appropriate contact time as indicated for disinfecting solutions.

## 2020-11-21 ENCOUNTER — Encounter: Payer: 59 | Admitting: Family

## 2020-12-18 ENCOUNTER — Encounter: Payer: Self-pay | Admitting: Family

## 2020-12-18 ENCOUNTER — Ambulatory Visit (INDEPENDENT_AMBULATORY_CARE_PROVIDER_SITE_OTHER): Payer: 59 | Admitting: Family

## 2020-12-18 ENCOUNTER — Other Ambulatory Visit: Payer: Self-pay

## 2020-12-18 VITALS — BP 134/75 | HR 89 | Resp 16 | Ht 59.5 in | Wt 157.8 lb

## 2020-12-18 DIAGNOSIS — E1169 Type 2 diabetes mellitus with other specified complication: Secondary | ICD-10-CM | POA: Insufficient documentation

## 2020-12-18 DIAGNOSIS — Z0000 Encounter for general adult medical examination without abnormal findings: Secondary | ICD-10-CM

## 2020-12-18 DIAGNOSIS — E119 Type 2 diabetes mellitus without complications: Secondary | ICD-10-CM

## 2020-12-18 DIAGNOSIS — E785 Hyperlipidemia, unspecified: Secondary | ICD-10-CM

## 2020-12-18 DIAGNOSIS — Z23 Encounter for immunization: Secondary | ICD-10-CM | POA: Diagnosis not present

## 2020-12-18 LAB — LIPID PANEL
Cholesterol: 124 mg/dL (ref 0–200)
HDL: 65.8 mg/dL (ref >39.00)
LDL Cholesterol: 46 mg/dL (ref 0–99)
NonHDL: 58.62
Total CHOL/HDL Ratio: 2
Triglycerides: 65 mg/dL (ref 0.0–149.0)
VLDL: 13 mg/dL (ref 0.0–40.0)

## 2020-12-18 LAB — COMPREHENSIVE METABOLIC PANEL
ALT: 18 U/L (ref 0–35)
AST: 17 U/L (ref 0–37)
Albumin: 4.6 g/dL (ref 3.5–5.2)
Alkaline Phosphatase: 94 U/L (ref 39–117)
BUN: 10 mg/dL (ref 6–23)
CO2: 29 mEq/L (ref 19–32)
Calcium: 9.7 mg/dL (ref 8.4–10.5)
Chloride: 102 mEq/L (ref 96–112)
Creatinine, Ser: 0.8 mg/dL (ref 0.40–1.20)
GFR: 89.43 mL/min (ref >60.00)
Glucose, Bld: 104 mg/dL — ABNORMAL HIGH (ref 70–99)
Potassium: 3.9 mEq/L (ref 3.5–5.1)
Sodium: 138 mEq/L (ref 135–145)
Total Bilirubin: 0.8 mg/dL (ref 0.2–1.2)
Total Protein: 7.1 g/dL (ref 6.0–8.3)

## 2020-12-18 LAB — HEMOGLOBIN A1C: Hgb A1c MFr Bld: 6 % (ref 4.6–6.5)

## 2020-12-18 NOTE — Progress Notes (Signed)
Subjective:     Patient ID: Rose Hansen, female    DOB: [birth year], 45 y.o.   MRN: [id number]  Chief Complaint  Patient presents with   Annual Exam    Doing well , no complaints    HPI Patient is in today for cpx.  Immunizations: 3 pfizers vaccines, tdap 2019/08/17, due for PCV 20 Diet: She is working as a travel Marine scientist.   Wt Readings from Last 3 Encounters:  12/18/20 157 lb 12.8 oz (71.6 kg)  08/21/20 152 lb 9.6 oz (69.2 kg)  02/20/20 135 lb (61.2 kg)  Exercise: not currently.  Colonoscopy: due in 04/02/21 Pap Smear: scheduled in 9/22 Mammogram: 01/26/2020 Vision: up to dated 2/21 Dental: up to date.    Lab Results  Component Value Date   HGBA1C 5.8 08/21/2020   HGBA1C 5.6 02/20/2020   HGBA1C 5.8 09/27/2019   Lab Results  Component Value Date   MICROALBUR <0.7 08/21/2020   LDLCALC 46 02/20/2020   CREATININE 0.80 08/21/2020   Anemia-  Lab Results  Component Value Date   WBC 8.1 12/16/2017   HGB 12.3 12/16/2017   HCT 36.9 12/16/2017   MCV 87.9 12/16/2017   PLT 336.0 12/16/2017    Health Maintenance Due  Topic Date Due   Pneumococcal Vaccine 19-25 Years old (2 - PCV) 02/13/2018   COVID-19 Vaccine (4 - Booster for Pfizer series) 09/08/2020    Past Medical History:  Diagnosis Date   Abnormal glucose    Circadian rhythm sleep disorder, shift work type    Diabetes type 2, controlled ([place]) 08/15/2016   Fatty liver 08/22/2016   Fibromyalgia    History of UTI    Mild anemia    Nonspecific reaction to tuberculin skin test without active tuberculosis(795.51)     Past Surgical History:  Procedure Laterality Date   ABDOMINAL HYSTERECTOMY N/A 12/07/2018   gyn surgery  08/17/2007   Dr. Nori Riis for benign cystic teratomas of right ovary,uterine fibroids, follicular cyst of left ovary    Family History  Problem Relation Age of Onset   Hypertension Father        (father was adopted)   Colon polyps Father    Alzheimer's disease Mother 23       end stage dementia    Asthma Sister    Hypertension Sister    Colon cancer Maternal Grandfather 08-17-43   Diabetes Mellitus II Maternal Grandfather    Diabetes Mellitus II Maternal Grandmother    CVA Maternal Grandmother     Social History   Socioeconomic History   Marital status: Single    Spouse name: Not on file   Number of children: 0   Years of education: Not on file   Highest education level: Not on file  Occupational History   Occupation: dialysis nurse at [employer]  Tobacco Use   Smoking status: Never   Smokeless tobacco: Never  Substance and Sexual Activity   Alcohol use: No   Drug use: No   Sexual activity: Not Currently  Other Topics Concern   Not on file  Social History Narrative   Mom- alzheimers- passed away August 16, 2017   Single   Dialysis RN- Occupational psychologist (works days)    Completed college   No children   Enjoys reading and spending time with nieces and nephews   Social Determinants of Health   Financial Resource Strain: Not on file  Food Insecurity: Not on file  Transportation Needs: Not on file  Physical Activity: Not on  file  Stress: Not on file  Social Connections: Not on file  Intimate Partner Violence: Not on file    Outpatient Medications Prior to Visit  Medication Sig Dispense Refill   atorvastatin (LIPITOR) 10 MG tablet TAKE 1 TABLET BY MOUTH EVERY DAY 90 tablet 1   No facility-administered medications prior to visit.    Allergies  Allergen Reactions   Pylera [Bis Subcit-Metronid-Tetracyc] Swelling    Swelling of face, eyes, lips.   Bismuth-Containing Compounds Swelling   Metronidazole Swelling   Tetracyclines & Related Swelling    Review of Systems  Constitutional:  Negative for weight loss.  HENT:  Negative for congestion.   Eyes:  Negative for blurred vision.  Respiratory:  Negative for cough.   Cardiovascular:  Negative for chest pain.  Gastrointestinal:  Negative for diarrhea, nausea and vomiting.  Genitourinary:  Negative for dysuria and  frequency.  Musculoskeletal:  Negative for joint pain and myalgias.  Skin:  Negative for rash.  Neurological:  Negative for headaches.  Psychiatric/Behavioral:         Denies depression/anxiety      Objective:    Physical Exam  BP 134/75   Pulse 89   Resp 16   Ht 4' 11.5" (1.511 m)   Wt 157 lb 12.8 oz (71.6 kg)   LMP 11/22/2018   SpO2 100%   BMI 31.34 kg/m  Wt Readings from Last 3 Encounters:  12/18/20 157 lb 12.8 oz (71.6 kg)  08/21/20 152 lb 9.6 oz (69.2 kg)  02/20/20 135 lb (61.2 kg)   Physical Exam  Constitutional: She is oriented to person, place, and time. She appears well-developed and well-nourished. No distress.  HENT:  Head: Normocephalic and atraumatic.  Right Ear: Tympanic membrane and ear canal normal.  Left Ear: Tympanic membrane and ear canal normal.  Mouth/Throat: Not examined- pt wearing mask Eyes: Pupils are equal, round, and reactive to light. No scleral icterus.  Neck: Normal range of motion. No thyromegaly present.  Cardiovascular: Normal rate and regular rhythm.   No murmur heard. Pulmonary/Chest: Effort normal and breath sounds normal. No respiratory distress. He has no wheezes. She has no rales. She exhibits no tenderness.  Abdominal: Soft. Bowel sounds are normal. She exhibits no distension and no mass. There is no tenderness. There is no rebound and no guarding.  Musculoskeletal: She exhibits no edema.  Lymphadenopathy:    She has no cervical adenopathy.  Neurological: She is alert and oriented to person, place, and time. She exhibits normal muscle tone. Coordination normal.  Skin: Skin is warm and dry.  Psychiatric: She has a normal mood and affect. Her behavior is normal. Judgment and thought content normal.  Breast/pelvic: deferred           Assessment & Plan:        Assessment & Plan:   Problem List Items Addressed This Visit       Unprioritized   Preventative health care    PCV-20 booster today. Tetanus up to date. Will  need colonoscopy after her birthday. Pap and mammo up to date.        Hyperlipidemia    Last LDL at goal. Continue atorvastatin 10mg .        Relevant Orders   Lipid panel   Comp Met (CMET)   Diabetes type 2, controlled ([place]) - Primary    Will obtain A1C, CMET.  She plans to try to add some exercise and work on her diet.  Relevant Orders   Hemoglobin A1c    I am having Rose Hansen maintain her atorvastatin.  No orders of the defined types were placed in this encounter.

## 2020-12-18 NOTE — Assessment & Plan Note (Signed)
Will obtain A1C, CMET.  She plans to try to add some exercise and work on her diet.

## 2020-12-18 NOTE — Assessment & Plan Note (Signed)
Last LDL at goal. Continue atorvastatin 10mg .

## 2020-12-18 NOTE — Patient Instructions (Signed)
Please complete lab work prior to leaving. Continue to work on healthy diet, exercise and weight loss.  

## 2020-12-18 NOTE — Assessment & Plan Note (Addendum)
PCV-20 booster today. Tetanus up to date. Will need colonoscopy after her birthday. Pap and mammo up to date.

## 2021-02-06 ENCOUNTER — Other Ambulatory Visit: Payer: Self-pay | Admitting: Family

## 2021-02-12 LAB — HM MAMMOGRAPHY

## 2021-04-01 ENCOUNTER — Telehealth: Payer: Self-pay | Admitting: Family

## 2021-04-01 DIAGNOSIS — Z1211 Encounter for screening for malignant neoplasm of colon: Secondary | ICD-10-CM

## 2021-04-01 NOTE — Telephone Encounter (Signed)
See mychart.  

## 2021-04-03 ENCOUNTER — Encounter: Payer: Self-pay | Admitting: Gastroenterology

## 2021-05-20 ENCOUNTER — Other Ambulatory Visit: Payer: Self-pay

## 2021-05-20 ENCOUNTER — Ambulatory Visit (AMBULATORY_SURGERY_CENTER): Payer: 59 | Admitting: *Deleted

## 2021-05-20 VITALS — Ht 59.0 in | Wt 160.0 lb

## 2021-05-20 DIAGNOSIS — Z1211 Encounter for screening for malignant neoplasm of colon: Secondary | ICD-10-CM

## 2021-05-20 MED ORDER — NA SULFATE-K SULFATE-MG SULF 17.5-3.13-1.6 GM/177ML PO SOLN: 1.0000 | Freq: Once | ORAL | 0 refills | Status: AC

## 2021-05-20 NOTE — Progress Notes (Signed)
No egg or soy allergy known to patient  °No issues known to pt with past sedation with any surgeries or procedures °Patient denies ever being told they had issues or difficulty with intubation  °No FH of Malignant Hyperthermia °Pt is not on diet pills °Pt is not on  home 02  °Pt is not on blood thinners  °Pt denies issues with constipation  °No A fib or A flutter ° °Pt is fully vaccinated  for Covid  ° ° NO PA's for preps discussed with pt In PV today  °Discussed with pt there will be an out-of-pocket cost for prep and that varies from $0 to 70 +  dollars - pt verbalized understanding  ° °Due to the COVID-19 pandemic we are asking patients to follow certain guidelines in PV and the [facility]   °Pt aware of COVID protocols and [facility] guidelines  ° °PV completed over the phone. Pt verified name, DOB, address and insurance during PV today.  °Pt mailed instruction packet with copy of consent form to read and not return, and instructions.  °Pt encouraged to call with questions or issues.  °If pt has My chart, procedure instructions sent via My Chart  ° °

## 2021-06-04 ENCOUNTER — Encounter: Payer: Self-pay | Admitting: Gastroenterology

## 2021-06-07 ENCOUNTER — Encounter: Payer: Self-pay | Admitting: Gastroenterology

## 2021-06-07 ENCOUNTER — Ambulatory Visit (AMBULATORY_SURGERY_CENTER): Payer: 59 | Admitting: Gastroenterology

## 2021-06-07 ENCOUNTER — Other Ambulatory Visit: Payer: Self-pay

## 2021-06-07 VITALS — BP 96/63 | HR 64 | Temp 97.5°F | Resp 17 | Ht 59.5 in | Wt 157.0 lb

## 2021-06-07 DIAGNOSIS — Z1211 Encounter for screening for malignant neoplasm of colon: Secondary | ICD-10-CM | POA: Diagnosis present

## 2021-06-07 MED ORDER — SODIUM CHLORIDE 0.9 % IV SOLN: 500.0000 mL | INTRAVENOUS | Status: DC

## 2021-06-07 NOTE — Progress Notes (Signed)
Sedate, gd SR, tolerated procedure well, VSS, report to RN 

## 2021-06-07 NOTE — Patient Instructions (Signed)
YOU HAD AN ENDOSCOPIC PROCEDURE TODAY AT THE [facility]:   Refer to the procedure report that was given to you for any specific questions about what was found during the examination.  If the procedure report does not answer your questions, please call your gastroenterologist to clarify.  If you requested that your care partner not be given the details of your procedure findings, then the procedure report has been included in a sealed envelope for you to review at your convenience later. ° °YOU SHOULD EXPECT: Some feelings of bloating in the abdomen. Passage of more gas than usual.  Walking can help get rid of the air that was put into your GI tract during the procedure and reduce the bloating. If you had a lower endoscopy (such as a colonoscopy or flexible sigmoidoscopy) you may notice spotting of blood in your stool or on the toilet paper. If you underwent a bowel prep for your procedure, you may not have a normal bowel movement for a few days. ° °Please Note:  You might notice some irritation and congestion in your nose or some drainage.  This is from the oxygen used during your procedure.  There is no need for concern and it should clear up in a day or so. ° °SYMPTOMS TO REPORT IMMEDIATELY: ° °Following lower endoscopy (colonoscopy or flexible sigmoidoscopy): ° Excessive amounts of blood in the stool ° Significant tenderness or worsening of abdominal pains ° Swelling of the abdomen that is new, acute ° Fever of 100°F or higher ° °For urgent or emergent issues, a gastroenterologist can be reached at any hour by calling [phone]. °Do not use MyChart messaging for urgent concerns.  ° ° °DIET:  We do recommend a small meal at first, but then you may proceed to your regular diet.  Drink plenty of fluids but you should avoid alcoholic beverages for 24 hours. ° °ACTIVITY:  You should plan to take it easy for the rest of today and you should NOT DRIVE or use heavy machinery until tomorrow (because of  the sedation medicines used during the test).   ° °FOLLOW UP: °Our staff will call the number listed on your records 48-72 hours following your procedure to check on you and address any questions or concerns that you may have regarding the information given to you following your procedure. If we do not reach you, we will leave a message.  We will attempt to reach you two times.  During this call, we will ask if you have developed any symptoms of COVID 19. If you develop any symptoms (ie: fever, flu-like symptoms, shortness of breath, cough etc.) before then, please call [phone].  If you test positive for Covid 19 in the 2 weeks post procedure, please call and report this information to us.   ° °SIGNATURES/CONFIDENTIALITY: °You and/or your care partner have signed paperwork which will be entered into your electronic medical record.  These signatures attest to the fact that that the information above on your After Visit Summary has been reviewed and is understood.  Full responsibility of the confidentiality of this discharge information lies with you and/or your care-partner.  °

## 2021-06-07 NOTE — Op Note (Signed)
[facility] Patient Name: Rose Hansen Procedure Date: 06/07/2021 8:08 AM MRN: [id number] Endoscopist: Sherilyn CooterHenry L. Myrtie Neitheranis , MD Age: [age] Referring MD:  Date of Birth: [birth year] Gender: Female Account #: [id number] Procedure:                Colonoscopy Indications:              Screening for colorectal malignant neoplasm, This                            is the patient's first colonoscopy Medicines:                Monitored Anesthesia Care Procedure:                Pre-Anesthesia Assessment:                           - Prior to the procedure, a History and Physical                            was performed, and patient medications and                            allergies were reviewed. The patient's tolerance of                            previous anesthesia was also reviewed. The risks                            and benefits of the procedure and the sedation                            options and risks were discussed with the patient.                            All questions were answered, and informed consent                            was obtained. Prior Anticoagulants: The patient has                            taken no previous anticoagulant or antiplatelet                            agents. ASA Grade Assessment: II - A patient with                            mild systemic disease. After reviewing the risks                            and benefits, the patient was deemed in                            satisfactory condition to undergo the procedure.  After obtaining informed consent, the colonoscope                            was passed under direct vision. Throughout the                            procedure, the patient's blood pressure, pulse, and                            oxygen saturations were monitored continuously. The                            Olympus CF-HQ190L ([id number]) Colonoscope was                            introduced through the anus  and advanced to the the                            cecum, identified by appendiceal orifice and                            ileocecal valve. The colonoscopy was performed                            without difficulty. The patient tolerated the                            procedure well. The quality of the bowel                            preparation was excellent. The ileocecal valve,                            appendiceal orifice, and rectum were photographed.                            The bowel preparation used was SUPREP. Scope In: 8:12:11 AM Scope Out: 8:23:16 AM Scope Withdrawal Time: 0 hours 8 minutes 3 seconds  Total Procedure Duration: 0 hours 11 minutes 5 seconds  Findings:                 The perianal and digital rectal examinations were                            normal.                           The entire examined colon appeared normal on direct                            and retroflexion views. Complications:            No immediate complications. Estimated Blood Loss:     Estimated blood loss: none. Impression:               - The entire examined colon is normal  on direct and                            retroflexion views.                           - No specimens collected. Recommendation:           - Patient has a contact number available for                            emergencies. The signs and symptoms of potential                            delayed complications were discussed with the                            patient. Return to normal activities tomorrow.                            Written discharge instructions were provided to the                            patient.                           - Resume previous diet.                           - Continue present medications.                           - Repeat colonoscopy in 10 years for screening                            purposes. Zyire Eidson L. Myrtie Neither, MD 06/07/2021 8:36:15 AM This report has been signed  electronically.

## 2021-06-07 NOTE — Progress Notes (Signed)
History and Physical:  This patient presents for endoscopic testing for: Encounter Diagnosis  Name Primary?   Special screening for malignant neoplasms, colon Yes    Average risk - first colonoscopy Patient denies chronic abdominal pain, rectal bleeding, constipation or diarrhea.  ROS: Patient denies chest pain or cough   Past Medical History: Past Medical History:  Diagnosis Date   Abnormal glucose    Circadian rhythm sleep disorder, shift work type    Diabetes type 2, controlled (HCC) 08/15/2016   Fatty liver 08/22/2016   Fibromyalgia    History of UTI    Hyperlipidemia    Mild anemia    Nonspecific reaction to tuberculin skin test without active tuberculosis(795.51)      Past Surgical History: Past Surgical History:  Procedure Laterality Date   ABDOMINAL HYSTERECTOMY N/A 12/07/2018   gyn surgery  08/2007   Dr. Jennette Kettle for benign cystic teratomas of right ovary,uterine fibroids, follicular cyst of left ovary   WISDOM TOOTH EXTRACTION      Allergies: Allergies  Allergen Reactions   Pylera [Bis Subcit-Metronid-Tetracyc] Swelling    Swelling of face, eyes, lips.   Bismuth-Containing Compounds Swelling   Metronidazole Swelling   Tetracyclines & Related Swelling    Outpatient Meds: Current Outpatient Medications  Medication Sig Dispense Refill   atorvastatin (LIPITOR) 10 MG tablet TAKE 1 TABLET BY MOUTH EVERY DAY 90 tablet 1   Current Facility-Administered Medications  Medication Dose Route Frequency Provider Last Rate Last Admin   0.9 %  sodium chloride infusion  500 mL Intravenous Continuous Danis, Starr Lake III, MD          ___________________________________________________________________ Objective   Exam:  BP 140/84    Pulse 65    Temp (!) 97.5 F (36.4 C)    Ht 4' 11.5" (1.511 m)    Wt 157 lb (71.2 kg)    LMP 11/22/2018    SpO2 99%    BMI 31.18 kg/m   CV: RRR without murmur, S1/S2 Resp: clear to auscultation bilaterally, normal RR and effort  noted GI: soft, no tenderness, with active bowel sounds.   Assessment: Encounter Diagnosis  Name Primary?   Special screening for malignant neoplasms, colon Yes     Plan: Colonoscopy  The benefits and risks of the planned procedure were described in detail with the patient or (when appropriate) their health care proxy.  Risks were outlined as including, but not limited to, bleeding, infection, perforation, adverse medication reaction leading to cardiac or pulmonary decompensation, pancreatitis (if ERCP).  The limitation of incomplete mucosal visualization was also discussed.  No guarantees or warranties were given.    The patient is appropriate for an endoscopic procedure in the ambulatory setting.   - Amada Jupiter, MD

## 2021-06-11 ENCOUNTER — Telehealth: Payer: Self-pay

## 2021-06-11 NOTE — Telephone Encounter (Signed)
Left message on follow up call. 

## 2021-06-11 NOTE — Telephone Encounter (Signed)
Second attempt follow up call to pt, no answer.  

## 2021-06-24 ENCOUNTER — Telehealth: Payer: Self-pay | Admitting: Family

## 2021-06-24 ENCOUNTER — Ambulatory Visit: Payer: 59 | Admitting: Family

## 2021-06-24 VITALS — BP 141/86 | HR 79 | Temp 98.6°F | Resp 16 | Ht 59.5 in | Wt 160.4 lb

## 2021-06-24 DIAGNOSIS — E785 Hyperlipidemia, unspecified: Secondary | ICD-10-CM | POA: Diagnosis not present

## 2021-06-24 DIAGNOSIS — R030 Elevated blood-pressure reading, without diagnosis of hypertension: Secondary | ICD-10-CM | POA: Diagnosis not present

## 2021-06-24 DIAGNOSIS — E119 Type 2 diabetes mellitus without complications: Secondary | ICD-10-CM

## 2021-06-24 LAB — COMPREHENSIVE METABOLIC PANEL
ALT: 12 U/L (ref 0–35)
AST: 15 U/L (ref 0–37)
Albumin: 4.7 g/dL (ref 3.5–5.2)
Alkaline Phosphatase: 95 U/L (ref 39–117)
BUN: 9 mg/dL (ref 6–23)
CO2: 27 mEq/L (ref 19–32)
Calcium: 9.6 mg/dL (ref 8.4–10.5)
Chloride: 103 mEq/L (ref 96–112)
Creatinine, Ser: 0.75 mg/dL (ref 0.40–1.20)
GFR: 96.28 mL/min (ref >60.00)
Glucose, Bld: 104 mg/dL — ABNORMAL HIGH (ref 70–99)
Potassium: 3.9 mEq/L (ref 3.5–5.1)
Sodium: 139 mEq/L (ref 135–145)
Total Bilirubin: 0.7 mg/dL (ref 0.2–1.2)
Total Protein: 7.3 g/dL (ref 6.0–8.3)

## 2021-06-24 LAB — HEMOGLOBIN A1C: Hgb A1c MFr Bld: 6.1 % (ref 4.6–6.5)

## 2021-06-24 NOTE — Patient Instructions (Addendum)
Please consider getting the new bivalent covid booster.  Please complete lab work prior to leaving.

## 2021-06-24 NOTE — Assessment & Plan Note (Signed)
She wishes to hold off of medication and instead work on low sodium diet, exercise, weight loss. Recommended follow up in 3 months. If bp still elevated at that time, then we will consider antihypertensive.  Wt Readings from Last 3 Encounters:  06/24/21 160 lb 6.4 oz (72.8 kg)  06/07/21 157 lb (71.2 kg)  05/20/21 160 lb (72.6 kg)   BP Readings from Last 3 Encounters:  06/24/21 (!) 141/86  06/07/21 96/63  12/18/20 134/75

## 2021-06-24 NOTE — Assessment & Plan Note (Signed)
LDL at goal.  Continue statin.   

## 2021-06-24 NOTE — Telephone Encounter (Signed)
Opened in error

## 2021-06-24 NOTE — Assessment & Plan Note (Signed)
Obtain follow up A1C.   

## 2021-06-24 NOTE — Progress Notes (Signed)
Subjective:     Patient ID: Rose Hansen, female    DOB: 1975/09/03, 46 y.o.   MRN: [id number]  Chief Complaint  Patient presents with   Diabetes    Here for follow up    Diabetes  Patient is in today for follow up.  DM2- reports that diet was not great over the holidays.   Lab Results  Component Value Date   HGBA1C 6.0 12/18/2020   HGBA1C 5.8 08/21/2020   HGBA1C 5.6 02/20/2020   Lab Results  Component Value Date   MICROALBUR <0.7 08/21/2020   LDLCALC 46 12/18/2020   CREATININE 0.80 12/18/2020   Hyperlipidemia- maintained on atorvastatin $RemoveBeforeD'10mg'AqbxiVcdfIispU$ .  Lab Results  Component Value Date   CHOL 124 12/18/2020   HDL 65.80 12/18/2020   LDLCALC 46 12/18/2020   TRIG 65.0 12/18/2020   CHOLHDL 2 12/18/2020      Health Maintenance Due  Topic Date Due   COVID-19 Vaccine (4 - Booster for Pfizer series) 08/05/2020   MAMMOGRAM  01/26/2021   HEMOGLOBIN A1C  06/20/2021   URINE MICROALBUMIN  08/21/2021    Past Medical History:  Diagnosis Date   Abnormal glucose    Circadian rhythm sleep disorder, shift work type    Diabetes type 2, controlled ([place]) 08/15/2016   Fatty liver 08/22/2016   Fibromyalgia    History of UTI    Hyperlipidemia    Mild anemia    Nonspecific reaction to tuberculin skin test without active tuberculosis(795.51)     Past Surgical History:  Procedure Laterality Date   ABDOMINAL HYSTERECTOMY N/A 12/07/2018   gyn surgery  03-Sep-2007   Dr. Nori Riis for benign cystic teratomas of right ovary,uterine fibroids, follicular cyst of left ovary   WISDOM TOOTH EXTRACTION      Family History  Problem Relation Age of Onset   Alzheimer's disease Mother 80       end stage dementia   Hypertension Father        (father was adopted)   Colon polyps Father    Asthma Sister    Hypertension Sister    Diabetes Mellitus II Maternal Grandmother    CVA Maternal Grandmother    Colon cancer Maternal Grandfather 09/03/2043   Diabetes Mellitus II Maternal Grandfather     Esophageal cancer Neg Hx    Stomach cancer Neg Hx    Rectal cancer Neg Hx     Social History   Socioeconomic History   Marital status: Single    Spouse name: Not on file   Number of children: 0   Years of education: Not on file   Highest education level: Not on file  Occupational History   Occupation: dialysis nurse at [employer]  Tobacco Use   Smoking status: Never   Smokeless tobacco: Never  Vaping Use   Vaping Use: Never used  Substance and Sexual Activity   Alcohol use: No   Drug use: No   Sexual activity: Not Currently  Other Topics Concern   Not on file  Social History Narrative   Mom- alzheimers- passed away 02-Sep-2017   Single   Dialysis RN- Occupational psychologist (works days)    Completed college   No children   Enjoys reading and spending time with nieces and nephews   Social Determinants of Radio broadcast assistant Strain: Not on file  Food Insecurity: Not on file  Transportation Needs: Not on file  Physical Activity: Not on file  Stress: Not on file  Social Connections: Not on  file  Intimate Partner Violence: Not on file    Outpatient Medications Prior to Visit  Medication Sig Dispense Refill   atorvastatin (LIPITOR) 10 MG tablet TAKE 1 TABLET BY MOUTH EVERY DAY 90 tablet 1   No facility-administered medications prior to visit.    Allergies  Allergen Reactions   Pylera [Bis Subcit-Metronid-Tetracyc] Swelling    Swelling of face, eyes, lips.   Bismuth-Containing Compounds Swelling   Metronidazole Swelling   Tetracyclines & Related Swelling    ROS    See HPI  Objective:    Physical Exam Constitutional:      General: She is not in acute distress.    Appearance: Normal appearance. She is well-developed.  HENT:     Head: Normocephalic and atraumatic.     Right Ear: External ear normal.     Left Ear: External ear normal.  Eyes:     General: No scleral icterus. Neck:     Thyroid: No thyromegaly.  Cardiovascular:     Rate and Rhythm:  Normal rate and regular rhythm.     Heart sounds: Normal heart sounds. No murmur heard. Pulmonary:     Effort: Pulmonary effort is normal. No respiratory distress.     Breath sounds: Normal breath sounds. No wheezing.  Musculoskeletal:     Cervical back: Neck supple.  Skin:    General: Skin is warm and dry.  Neurological:     Mental Status: She is alert and oriented to person, place, and time.  Psychiatric:        Mood and Affect: Mood normal.        Behavior: Behavior normal.        Thought Content: Thought content normal.        Judgment: Judgment normal.    BP (!) 141/86 (BP Location: Right Arm, Patient Position: Sitting, Cuff Size: Small)    Pulse 79    Temp 98.6 F (37 C) (Oral)    Resp 16    Ht 4' 11.5" (1.511 m)    Wt 160 lb 6.4 oz (72.8 kg)    LMP 11/22/2018    SpO2 100%    BMI 31.85 kg/m  Wt Readings from Last 3 Encounters:  06/24/21 160 lb 6.4 oz (72.8 kg)  06/07/21 157 lb (71.2 kg)  05/20/21 160 lb (72.6 kg)   Diabetic Foot Exam - Simple   Simple Foot Form Diabetic Foot exam was performed with the following findings: Yes 06/24/2021  8:26 AM  Visual Inspection No deformities, no ulcerations, no other skin breakdown bilaterally: Yes Sensation Testing Intact to touch and monofilament testing bilaterally: Yes Pulse Check Posterior Tibialis and Dorsalis pulse intact bilaterally: Yes Comments        Assessment & Plan:   Problem List Items Addressed This Visit       Unprioritized   Hyperlipidemia    LDL at goal. Continue statin.       Elevated blood pressure reading    She wishes to hold off of medication and instead work on low sodium diet, exercise, weight loss. Recommended follow up in 3 months. If bp still elevated at that time, then we will consider antihypertensive.  Wt Readings from Last 3 Encounters:  06/24/21 160 lb 6.4 oz (72.8 kg)  06/07/21 157 lb (71.2 kg)  05/20/21 160 lb (72.6 kg)   BP Readings from Last 3 Encounters:  06/24/21 (!) 141/86   06/07/21 96/63  12/18/20 134/75        Diabetes type 2, controlled (HCC) -  Primary    Obtain follow up A1C.       Relevant Orders   Comp Met (CMET)   Hemoglobin A1c    I am having Rose Hansen maintain her atorvastatin.  No orders of the defined types were placed in this encounter.

## 2021-08-02 LAB — HM DIABETES EYE EXAM

## 2021-08-05 ENCOUNTER — Other Ambulatory Visit: Payer: Self-pay | Admitting: Family

## 2021-09-23 ENCOUNTER — Ambulatory Visit: Payer: 59 | Admitting: Family

## 2021-10-07 ENCOUNTER — Ambulatory Visit: Payer: 59 | Admitting: Family

## 2021-10-07 ENCOUNTER — Ambulatory Visit (HOSPITAL_BASED_OUTPATIENT_CLINIC_OR_DEPARTMENT_OTHER)
Admission: RE | Admit: 2021-10-07 | Discharge: 2021-10-07 | Disposition: A | Discharge status: Home / Self Care | Payer: 59 | Source: Ambulatory Visit | Attending: Family | Admitting: Family

## 2021-10-07 VITALS — BP 123/75 | HR 79 | Temp 98.5°F | Resp 16 | Wt 160.0 lb

## 2021-10-07 DIAGNOSIS — E785 Hyperlipidemia, unspecified: Secondary | ICD-10-CM

## 2021-10-07 DIAGNOSIS — Z9289 Personal history of other medical treatment: Secondary | ICD-10-CM

## 2021-10-07 DIAGNOSIS — E1169 Type 2 diabetes mellitus with other specified complication: Secondary | ICD-10-CM | POA: Diagnosis not present

## 2021-10-07 DIAGNOSIS — E119 Type 2 diabetes mellitus without complications: Secondary | ICD-10-CM | POA: Diagnosis not present

## 2021-10-07 LAB — RENAL FUNCTION PANEL
Albumin: 4.9 g/dL (ref 3.5–5.2)
BUN: 15 mg/dL (ref 6–23)
CO2: 27 mEq/L (ref 19–32)
Calcium: 10 mg/dL (ref 8.4–10.5)
Chloride: 106 mEq/L (ref 96–112)
Creatinine, Ser: 0.81 mg/dL (ref 0.40–1.20)
GFR: 87.61 mL/min (ref >60.00)
Glucose, Bld: 106 mg/dL — ABNORMAL HIGH (ref 70–99)
Phosphorus: 3.7 mg/dL (ref 2.3–4.6)
Potassium: 4.2 mEq/L (ref 3.5–5.1)
Sodium: 141 mEq/L (ref 135–145)

## 2021-10-07 LAB — MICROALBUMIN / CREATININE URINE RATIO
Creatinine,U: 284.4 mg/dL
Microalb Creat Ratio: 1.8 mg/g (ref 0.0–30.0)
Microalb, Ur: 5.2 mg/dL — ABNORMAL HIGH (ref 0.0–1.9)

## 2021-10-07 LAB — HEMOGLOBIN A1C: Hgb A1c MFr Bld: 6 % (ref 4.6–6.5)

## 2021-10-07 LAB — LIPID PANEL
Cholesterol: 103 mg/dL (ref 0–200)
HDL: 56.4 mg/dL (ref >39.00)
LDL Cholesterol: 38 mg/dL (ref 0–99)
NonHDL: 46.98
Total CHOL/HDL Ratio: 2
Triglycerides: 47 mg/dL (ref 0.0–149.0)
VLDL: 9.4 mg/dL (ref 0.0–40.0)

## 2021-10-07 NOTE — Patient Instructions (Signed)
Please complete lab work prior to leaving.   

## 2021-10-07 NOTE — Assessment & Plan Note (Signed)
Due for annual CXR.  ?

## 2021-10-07 NOTE — Assessment & Plan Note (Signed)
Diet controlled.  

## 2021-10-07 NOTE — Progress Notes (Signed)
? ?Subjective:  ? ? ? Patient ID: Rose Hansen, female    DOB: 22-Nov-1975, 46 y.o.   MRN: [id number] ? ?Chief Complaint  ?Patient presents with  ? Hypertension  ?  Here for follow up  ? Diabetes  ?  Here for follow up  ? ? ?HPI ?Patient is in today for follow up. 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  ? ?Elevated bp reading- Not currently on an antihypertensive.  ?BP Readings from Last 3 Encounters:  ?10/07/21 123/75  ?06/24/21 (!) 141/86  ?06/07/21 96/63  ? ?DM2- diet controlled. ? ? ?Lab Results  ?Component Value Date  ? HGBA1C 6.1 06/24/2021  ? HGBA1C 6.0 12/18/2020  ? HGBA1C 5.8 08/21/2020  ? ?Lab Results  ?Component Value Date  ? MICROALBUR <0.7 08/21/2020  ? LDLCALC 46 12/18/2020  ? CREATININE 0.75 06/24/2021  ? ?Hyperlipidemia- maintained on lipitor $RemoveBe'10mg'ftlpdhRCK$ .  ?Lab Results  ?Component Value Date  ? CHOL 124 12/18/2020  ? HDL 65.80 12/18/2020  ? LDLCALC 46 12/18/2020  ? TRIG 65.0 12/18/2020  ? CHOLHDL 2 12/18/2020  ? ? ? ? ?Health Maintenance Due  ?Topic Date Due  ? COVID-19 Vaccine (4 - Booster for Pfizer series) 08/05/2020  ? URINE MICROALBUMIN  08/21/2021  ? ? ?Past Medical History:  ?Diagnosis Date  ? Abnormal glucose   ? Circadian rhythm sleep disorder, shift work type   ? Diabetes type 2, controlled ([place]) 08/15/2016  ? Fatty liver 08/22/2016  ? Fibromyalgia   ? History of UTI   ? Hyperlipidemia   ? Mild anemia   ? Nonspecific reaction to tuberculin skin test without active tuberculosis(795.51)   ? ? ?Past Surgical History:  ?Procedure Laterality Date  ? ABDOMINAL HYSTERECTOMY N/A 12/07/2018  ? gyn surgery  08/11/2007  ? Dr. Nori Riis for benign cystic teratomas of right ovary,uterine fibroids, follicular cyst of left ovary  ? WISDOM TOOTH EXTRACTION    ? ? ?Family History  ?Problem Relation Age of Onset  ? Alzheimer's disease Mother 59  ?     end stage dementia  ? Hypertension Father   ?     (father was adopted)  ? Colon polyps Father   ? Asthma Sister   ? Hypertension  Sister   ? Diabetes Mellitus II Maternal Grandmother   ? CVA Maternal Grandmother   ? Colon cancer Maternal Grandfather 29  ? Diabetes Mellitus II Maternal Grandfather   ? Esophageal cancer Neg Hx   ? Stomach cancer Neg Hx   ? Rectal cancer Neg Hx   ? ? ?Social History  ? ?Socioeconomic History  ? Marital status: Single  ?  Spouse name: Not on file  ? Number of children: 0  ? Years of education: Not on file  ? Highest education level: Not on file  ?Occupational History  ? Occupation: dialysis nurse at [employer]  ?Tobacco Use  ? Smoking status: Never  ? Smokeless tobacco: Never  ?Vaping Use  ? Vaping Use: Never used  ?Substance and Sexual Activity  ? Alcohol use: No  ? Drug use: No  ? Sexual activity: Not Currently  ?Other Topics Concern  ? Not on file  ?Social History Narrative  ? Mom- alzheimers- passed away 10-Aug-2017  ? Single  ? Dialysis RN- clinical coordinator (works days)   ? Completed college  ? No children  ? Enjoys reading and spending time with nieces and nephews  ? ?Social Determinants of Health  ? ?Emergency planning/management officer  Strain: Not on file  ?Food Insecurity: Not on file  ?Transportation Needs: Not on file  ?Physical Activity: Not on file  ?Stress: Not on file  ?Social Connections: Not on file  ?Intimate Partner Violence: Not on file  ? ? ?Outpatient Medications Prior to Visit  ?Medication Sig Dispense Refill  ? atorvastatin (LIPITOR) 10 MG tablet TAKE 1 TABLET BY MOUTH EVERY DAY 90 tablet 1  ? ?No facility-administered medications prior to visit.  ? ? ?Allergies  ?Allergen Reactions  ? Pylera [Bis Subcit-Metronid-Tetracyc] Swelling  ?  Swelling of face, eyes, lips.  ? Bismuth-Containing Compounds Swelling  ? Metronidazole Swelling  ? Tetracyclines & Related Swelling  ? ? ?ROS ? ?See HPI ? ?  See HPI ?Objective:  ?  ?Physical Exam ?Constitutional:   ?   General: She is not in acute distress. ?   Appearance: Normal appearance. She is well-developed.  ?HENT:  ?   Head: Normocephalic and atraumatic.  ?   Right Ear:  External ear normal.  ?   Left Ear: External ear normal.  ?Eyes:  ?   General: No scleral icterus. ?Neck:  ?   Thyroid: No thyromegaly.  ?Cardiovascular:  ?   Rate and Rhythm: Normal rate and regular rhythm.  ?   Heart sounds: Normal heart sounds. No murmur heard. ?Pulmonary:  ?   Effort: Pulmonary effort is normal. No respiratory distress.  ?   Breath sounds: Normal breath sounds. No wheezing.  ?Musculoskeletal:  ?   Cervical back: Neck supple.  ?Skin: ?   General: Skin is warm and dry.  ?Neurological:  ?   Mental Status: She is alert and oriented to person, place, and time.  ?Psychiatric:     ?   Mood and Affect: Mood normal.     ?   Behavior: Behavior normal.     ?   Thought Content: Thought content normal.     ?   Judgment: Judgment normal.  ? ? ?BP 123/75 (BP Location: Right Arm, Patient Position: Sitting, Cuff Size: Small)   Pulse 79   Temp 98.5 ?F (36.9 ?C) (Oral)   Resp 16   Wt 160 lb (72.6 kg)   LMP 11/22/2018   SpO2 100%   BMI 31.78 kg/m?  ?Wt Readings from Last 3 Encounters:  ?10/07/21 160 lb (72.6 kg)  ?06/24/21 160 lb 6.4 oz (72.8 kg)  ?06/07/21 157 lb (71.2 kg)  ? ? ?   ?Assessment & Plan:  ? ?Problem List Items Addressed This Visit   ? ?  ? Unprioritized  ? Hyperlipidemia associated with type 2 diabetes mellitus ([place])  ?  Lab Results  ?Component Value Date  ? CHOL 124 12/18/2020  ? HDL 65.80 12/18/2020  ? LDLCALC 46 12/18/2020  ? TRIG 65.0 12/18/2020  ? CHOLHDL 2 12/18/2020  ?LDL at goal last visit. Will repeat today. Continue atorvastatin.  ?  ?  ? Relevant Orders  ? Lipid panel  ? History of positive PPD  ?  Due for annual CXR.  ? ?  ?  ? Relevant Orders  ? DG Chest 2 View  ? Diabetes type 2, controlled ([place]) - Primary  ?  Diet controlled.  ? ?  ?  ? Relevant Orders  ? Hemoglobin A1c  ? Urine Microalbumin w/creat. ratio  ? Renal function panel with eGFR  ? ? ?I am having Rose Hansen maintain her atorvastatin. ? ?No orders of the defined types were placed in this encounter. ? ?

## 2021-10-07 NOTE — Assessment & Plan Note (Signed)
Lab Results  ?Component Value Date  ? CHOL 124 12/18/2020  ? HDL 65.80 12/18/2020  ? LDLCALC 46 12/18/2020  ? TRIG 65.0 12/18/2020  ? CHOLHDL 2 12/18/2020  ? ?LDL at goal last visit. Will repeat today. Continue atorvastatin.  ?

## 2021-10-08 ENCOUNTER — Telehealth: Payer: Self-pay | Admitting: Family

## 2021-10-08 ENCOUNTER — Ambulatory Visit: Payer: 59 | Admitting: Family

## 2021-10-08 MED ORDER — LISINOPRIL 2.5 MG PO TABS: 2.5000 mg | ORAL_TABLET | Freq: Every day | ORAL | 1 refills | Status: DC

## 2021-10-08 NOTE — Telephone Encounter (Signed)
Called but no answer, lvm for patient to call back 

## 2021-10-08 NOTE — Telephone Encounter (Signed)
Please advise pt that her urine is showing some protein from the diabetes. I would recommend that she add low dose lisinopril 2.5mg  once daily for renal protection. Repeat bmet in 1 week, dx microalbuminuria. A1C looks good at 6.0.  ?

## 2021-10-08 NOTE — Telephone Encounter (Signed)
Patient advised of results, new rx and scheduled to come in next week for labs.  ?

## 2021-10-11 ENCOUNTER — Other Ambulatory Visit: Payer: Self-pay | Admitting: *Deleted

## 2021-10-11 DIAGNOSIS — R809 Proteinuria, unspecified: Secondary | ICD-10-CM

## 2021-10-15 ENCOUNTER — Other Ambulatory Visit (INDEPENDENT_AMBULATORY_CARE_PROVIDER_SITE_OTHER): Payer: 59

## 2021-10-15 ENCOUNTER — Other Ambulatory Visit: Payer: 59

## 2021-10-15 ENCOUNTER — Ambulatory Visit: Payer: 59 | Admitting: Family

## 2021-10-15 DIAGNOSIS — R809 Proteinuria, unspecified: Secondary | ICD-10-CM

## 2021-10-15 LAB — BASIC METABOLIC PANEL
BUN: 12 mg/dL (ref 6–23)
CO2: 27 mEq/L (ref 19–32)
Calcium: 9.9 mg/dL (ref 8.4–10.5)
Chloride: 104 mEq/L (ref 96–112)
Creatinine, Ser: 0.87 mg/dL (ref 0.40–1.20)
GFR: 80.4 mL/min (ref >60.00)
Glucose, Bld: 119 mg/dL — ABNORMAL HIGH (ref 70–99)
Potassium: 4 mEq/L (ref 3.5–5.1)
Sodium: 141 mEq/L (ref 135–145)

## 2022-01-09 ENCOUNTER — Telehealth (INDEPENDENT_AMBULATORY_CARE_PROVIDER_SITE_OTHER): Payer: 59 | Admitting: Family

## 2022-01-09 VITALS — BP 128/77 | Temp 99.4°F | Ht 59.0 in | Wt 157.0 lb

## 2022-01-09 DIAGNOSIS — J069 Acute upper respiratory infection, unspecified: Secondary | ICD-10-CM | POA: Insufficient documentation

## 2022-01-09 MED ORDER — BENZONATATE 100 MG PO CAPS: 100.0000 mg | ORAL_CAPSULE | Freq: Three times a day (TID) | ORAL | 0 refills | Status: DC | PRN

## 2022-01-09 NOTE — Assessment & Plan Note (Addendum)
Recommend that she add mucinex otc for congestion, tylenol as needed for headache/fever/body aches.  Tessalon as needed for cough. Increase fluids/rest. I have written her out of work until Monday. Recommended that she retest for covid over the weekend.

## 2022-01-09 NOTE — Progress Notes (Signed)
MyChart Video Visit    Virtual Visit via Video Note   This visit type was conducted due to national recommendations for restrictions regarding the COVID-19 Pandemic (e.g. social distancing) in an effort to limit this patient's exposure and mitigate transmission in our community. This patient is at least at moderate risk for complications without adequate follow up. This format is felt to be most appropriate for this patient at this time. Physical exam was limited by quality of the video and audio technology used for the visit. CMA was able to get the patient set up on a video visit.  Patient location: Home. Patient and provider in visit Provider location: Office  I discussed the limitations of evaluation and management by telemedicine and the availability of in person appointments. The patient expressed understanding and agreed to proceed.  Visit Date: 01/09/2022  Today's healthcare provider: Lemont Fillers, NP     Subjective:    Patient ID: Rose Hansen, female    DOB: [birth year], 46 y.o.   MRN: [id number]  Chief Complaint  Patient presents with   Nasal Congestion    Complains of Nasal Congestion     HPI  Reports that she developed a real bad headache Tuesday. Wednesday she developed cough. Today developed diarrhea.  Reports temp 99.5 today. HA comes/go.   Took covid test yesterday at home which was negative.  Past Medical History:  Diagnosis Date   Abnormal glucose    Circadian rhythm sleep disorder, shift work type    Diabetes type 2, controlled (HCC) 08/15/2016   Fatty liver 08/22/2016   Fibromyalgia    History of UTI    Hyperlipidemia    Mild anemia    Nonspecific reaction to tuberculin skin test without active tuberculosis(795.51)     Past Surgical History:  Procedure Laterality Date   ABDOMINAL HYSTERECTOMY N/A 12/07/2018   gyn surgery  08/2007   Dr. Jennette Kettle for benign cystic teratomas of right ovary,uterine fibroids, follicular cyst of left  ovary   WISDOM TOOTH EXTRACTION      Family History  Problem Relation Age of Onset   Alzheimer's disease Mother 41       end stage dementia   Hypertension Father        (father was adopted)   Colon polyps Father    Asthma Sister    Hypertension Sister    Diabetes Mellitus II Maternal Grandmother    CVA Maternal Grandmother    Colon cancer Maternal Grandfather 09/08/2043   Diabetes Mellitus II Maternal Grandfather    Esophageal cancer Neg Hx    Stomach cancer Neg Hx    Rectal cancer Neg Hx     Social History   Socioeconomic History   Marital status: Single    Spouse name: Not on file   Number of children: 0   Years of education: Not on file   Highest education level: Not on file  Occupational History   Occupation: dialysis nurse at [employer]  Tobacco Use   Smoking status: Never   Smokeless tobacco: Never  Vaping Use   Vaping Use: Never used  Substance and Sexual Activity   Alcohol use: No   Drug use: No   Sexual activity: Not Currently  Other Topics Concern   Not on file  Social History Narrative   Mom- alzheimers- passed away September 07, 2017   Single   Dialysis RN- clinical coordinator (works days)    Completed college   No children   Enjoys reading and spending  time with nieces and nephews   Social Determinants of Corporate investment banker Strain: Not on file  Food Insecurity: Not on file  Transportation Needs: Not on file  Physical Activity: Not on file  Stress: Not on file  Social Connections: Not on file  Intimate Partner Violence: Not on file    Outpatient Medications Prior to Visit  Medication Sig Dispense Refill   atorvastatin (LIPITOR) 10 MG tablet TAKE 1 TABLET BY MOUTH EVERY DAY 90 tablet 1   lisinopril (ZESTRIL) 2.5 MG tablet Take 1 tablet (2.5 mg total) by mouth daily. 90 tablet 1   No facility-administered medications prior to visit.    Allergies  Allergen Reactions   Pylera [Bis Subcit-Metronid-Tetracyc] Swelling    Swelling of face, eyes, lips.    Bismuth-Containing Compounds Swelling   Metronidazole Swelling   Tetracyclines & Related Swelling    ROS See HPI    Objective:    Physical Exam  BP 128/77 (BP Location: Right Arm, Patient Position: Sitting, Cuff Size: Normal)   Temp 99.4 F (37.4 C) (Oral)   Ht 4\' 11"  (1.499 m)   Wt 157 lb (71.2 kg)   LMP 11/22/2018   BMI 31.71 kg/m  Wt Readings from Last 3 Encounters:  01/09/22 157 lb (71.2 kg)  10/07/21 160 lb (72.6 kg)  06/24/21 160 lb 6.4 oz (72.8 kg)    Gen: Awake, alert, no acute distress Resp: Breathing is even and non-labored. Intermittent cough ENT: hoarse voice, attempting to clear throat of thick sputum frequently Psych: calm/pleasant demeanor Neuro: Alert and Oriented x 3, + facial symmetry, speech is clear.     Assessment & Plan:   Problem List Items Addressed This Visit       Unprioritized   Viral URI with cough - Primary    Recommend that she add mucinex otc for congestion, tylenol as needed for headache/fever/body aches.  Tessalon as needed for cough. Increase fluids/rest. I have written her out of work until Monday. Recommended that she retest for covid over the weekend.       I am having Kalise A. Bechtel start on benzonatate. I am also having her maintain her atorvastatin and lisinopril.  Meds ordered this encounter  Medications   benzonatate (TESSALON) 100 MG capsule    Sig: Take 1 capsule (100 mg total) by mouth 3 (three) times daily as needed.    Dispense:  30 capsule    Refill:  0    Order Specific Question:   Supervising Provider    Answer:   Saturday A [[id number]]    I discussed the assessment and treatment plan with the patient. The patient was provided an opportunity to ask questions and all were answered. The patient agreed with the plan and demonstrated an understanding of the instructions.   The patient was advised to call back or seek an in-person evaluation if the symptoms worsen or if the condition fails to improve as  anticipated.  Danise Edge, NP Lemont Fillers at [facility] [phone] (phone) [phone] (fax)  [facility]

## 2022-01-31 ENCOUNTER — Other Ambulatory Visit: Payer: Self-pay | Admitting: Family

## 2022-02-07 ENCOUNTER — Ambulatory Visit: Payer: 59 | Admitting: Family

## 2022-02-07 VITALS — BP 129/81 | HR 77 | Temp 98.2°F | Resp 16 | Wt 154.6 lb

## 2022-02-07 DIAGNOSIS — E1169 Type 2 diabetes mellitus with other specified complication: Secondary | ICD-10-CM

## 2022-02-07 DIAGNOSIS — R809 Proteinuria, unspecified: Secondary | ICD-10-CM | POA: Diagnosis not present

## 2022-02-07 DIAGNOSIS — Z23 Encounter for immunization: Secondary | ICD-10-CM | POA: Diagnosis not present

## 2022-02-07 DIAGNOSIS — E119 Type 2 diabetes mellitus without complications: Secondary | ICD-10-CM

## 2022-02-07 DIAGNOSIS — R030 Elevated blood-pressure reading, without diagnosis of hypertension: Secondary | ICD-10-CM

## 2022-02-07 DIAGNOSIS — E785 Hyperlipidemia, unspecified: Secondary | ICD-10-CM

## 2022-02-07 LAB — BASIC METABOLIC PANEL
BUN: 11 mg/dL (ref 6–23)
CO2: 29 mEq/L (ref 19–32)
Calcium: 9.7 mg/dL (ref 8.4–10.5)
Chloride: 105 mEq/L (ref 96–112)
Creatinine, Ser: 0.77 mg/dL (ref 0.40–1.20)
GFR: 92.88 mL/min (ref >60.00)
Glucose, Bld: 103 mg/dL — ABNORMAL HIGH (ref 70–99)
Potassium: 4 mEq/L (ref 3.5–5.1)
Sodium: 140 mEq/L (ref 135–145)

## 2022-02-07 LAB — HEMOGLOBIN A1C: Hgb A1c MFr Bld: 6 % (ref 4.6–6.5)

## 2022-02-07 NOTE — Progress Notes (Signed)
Subjective:   By signing my name below, I, Rose Hansen, attest that this documentation has been prepared under the direction and in the presence of Rose Hansen' [place], NP 02/07/2022    Patient ID: Rose Hansen, female    DOB: Feb 28, 1976, 46 y.o.   MRN: [id number]  Chief Complaint  Patient presents with   Diabetes    Here for follow up   Hyperlipidemia    Here for follow     HPI Patient is in today for an office visit  A1C: Her A1C levels are normal.  Lab Results  Component Value Date   HGBA1C 6.0 10/07/2021   Cholesterol: Her cholesterol levels are normal. She is currently taking 10 Mg of Lipitor Lab Results  Component Value Date   CHOL 103 10/07/2021   HDL 56.40 10/07/2021   LDLCALC 38 10/07/2021   TRIG 47.0 10/07/2021   CHOLHDL 2 10/07/2021   Blood Pressure: Her blood pressure levels are normal. She is currently taking 2.5 Mg of Lisinopril BP Readings from Last 3 Encounters:  02/07/22 129/81  01/09/22 128/77  10/07/21 123/75   Pulse Readings from Last 3 Encounters:  02/07/22 77  10/07/21 79  06/24/21 79   Mammogram: She reports that she is scheduled for a mammogram next week.   Health Maintenance Due  Topic Date Due   COVID-19 Vaccine (4 - Pfizer risk series) 08/05/2020    Past Medical History:  Diagnosis Date   Abnormal glucose    Circadian rhythm sleep disorder, shift work type    Diabetes type 2, controlled (HCC) 08/15/2016   Fatty liver 08/22/2016   Fibromyalgia    History of UTI    Hyperlipidemia    Mild anemia    Nonspecific reaction to tuberculin skin test without active tuberculosis(795.51)     Past Surgical History:  Procedure Laterality Date   ABDOMINAL HYSTERECTOMY N/A 12/07/2018   gyn surgery  08/2007   Dr. Jennette Kettle for benign cystic teratomas of right ovary,uterine fibroids, follicular cyst of left ovary   WISDOM TOOTH EXTRACTION      Family History  Problem Relation Age of Onset   Alzheimer's disease Mother 69        end stage dementia   Hypertension Father        (father was adopted)   Colon polyps Father    Asthma Sister    Hypertension Sister    Diabetes Mellitus II Maternal Grandmother    CVA Maternal Grandmother    Colon cancer Maternal Grandfather 2043-09-25   Diabetes Mellitus II Maternal Grandfather    Esophageal cancer Neg Hx    Stomach cancer Neg Hx    Rectal cancer Neg Hx     Social History   Socioeconomic History   Marital status: Single    Spouse name: Not on file   Number of children: 0   Years of education: Not on file   Highest education level: Not on file  Occupational History   Occupation: dialysis nurse at [employer]  Tobacco Use   Smoking status: Never   Smokeless tobacco: Never  Vaping Use   Vaping Use: Never used  Substance and Sexual Activity   Alcohol use: No   Drug use: No   Sexual activity: Not Currently  Other Topics Concern   Not on file  Social History Narrative   Mom- alzheimers- passed away 2017-09-24   Single   Dialysis RN- clinical coordinator (works days)    Completed college   No children  Enjoys reading and spending time with nieces and nephews   Social Determinants of Corporate investment banker Strain: Not on file  Food Insecurity: Not on file  Transportation Needs: Not on file  Physical Activity: Not on file  Stress: Not on file  Social Connections: Not on file  Intimate Partner Violence: Not on file    Outpatient Medications Prior to Visit  Medication Sig Dispense Refill   atorvastatin (LIPITOR) 10 MG tablet Take 1 tablet (10 mg total) by mouth daily. 90 tablet 1   benzonatate (TESSALON) 100 MG capsule Take 1 capsule (100 mg total) by mouth 3 (three) times daily as needed. 30 capsule 0   lisinopril (ZESTRIL) 2.5 MG tablet Take 1 tablet (2.5 mg total) by mouth daily. 90 tablet 1   No facility-administered medications prior to visit.    Allergies  Allergen Reactions   Pylera [Bis Subcit-Metronid-Tetracyc] Swelling    Swelling of face,  eyes, lips.   Bismuth-Containing Compounds Swelling   Metronidazole Swelling   Tetracyclines & Related Swelling    ROS See HPI    Objective:    Physical Exam Constitutional:      General: She is not in acute distress.    Appearance: Normal appearance. She is not ill-appearing.  HENT:     Head: Normocephalic and atraumatic.     Right Ear: External ear normal.     Left Ear: External ear normal.  Eyes:     Extraocular Movements: Extraocular movements intact.     Pupils: Pupils are equal, round, and reactive to light.  Cardiovascular:     Rate and Rhythm: Normal rate and regular rhythm.     Heart sounds: Normal heart sounds. No murmur heard.    No gallop.  Pulmonary:     Effort: Pulmonary effort is normal. No respiratory distress.     Breath sounds: Normal breath sounds. No wheezing or rales.  Skin:    General: Skin is warm and dry.  Neurological:     Mental Status: She is alert and oriented to person, place, and time.  Psychiatric:        Mood and Affect: Mood normal.        Behavior: Behavior normal.        Judgment: Judgment normal.     BP 129/81 (BP Location: Right Arm, Patient Position: Sitting, Cuff Size: Small)   Pulse 77   Temp 98.2 F (36.8 C) (Oral)   Resp 16   Wt 154 lb 9.6 oz (70.1 kg)   LMP 11/22/2018   SpO2 100%   BMI 31.23 kg/m  Wt Readings from Last 3 Encounters:  02/07/22 154 lb 9.6 oz (70.1 kg)  01/09/22 157 lb (71.2 kg)  10/07/21 160 lb (72.6 kg)       Assessment & Plan:   Problem List Items Addressed This Visit       Unprioritized   Microalbuminuria    Continue good glycemic/bp control and low dose lisinopril.       Hyperlipidemia associated with type 2 diabetes mellitus (HCC)    LDL at goal. Continue atorvastatin.       Elevated blood pressure reading    BP Readings from Last 3 Encounters:  02/07/22 129/81  01/09/22 128/77  10/07/21 123/75  BP is stable.       Diabetes type 2, controlled (HCC)    A1C at goal. Continue  diabetic diet.       Relevant Orders   Hemoglobin A1c   Basic metabolic  panel   Other Visit Diagnoses     Needs flu shot    -  Primary   Relevant Orders   Flu Vaccine QUAD 6+ mos PF IM (Fluarix Quad PF) (Completed)      No orders of the defined types were placed in this encounter.   I, Rose Fillers, NP, personally preformed the services described in this documentation.  All medical record entries made by the scribe were at my direction and in my presence.  I have reviewed the chart and discharge instructions (if applicable) and agree that the record reflects my personal performance and is accurate and complete. 02/07/2022   I,Amber Collins,acting as a scribe for Rose Fillers, NP.,have documented all relevant documentation on the behalf of Rose Fillers, NP,as directed by  Rose Fillers, NP while in the presence of Rose Fillers, NP.    Rose Fillers, NP

## 2022-02-07 NOTE — Assessment & Plan Note (Signed)
A1C at goal.  Continue diabetic diet.  

## 2022-02-07 NOTE — Assessment & Plan Note (Signed)
LDL at goal. Continue atorvastatin.  

## 2022-02-07 NOTE — Assessment & Plan Note (Signed)
Continue good glycemic/bp control and low dose lisinopril.

## 2022-02-07 NOTE — Assessment & Plan Note (Signed)
BP Readings from Last 3 Encounters:  02/07/22 129/81  01/09/22 128/77  10/07/21 123/75   BP is stable.

## 2022-03-31 ENCOUNTER — Other Ambulatory Visit: Payer: Self-pay | Admitting: Family

## 2022-06-09 ENCOUNTER — Encounter: Payer: Self-pay | Admitting: Family

## 2022-06-09 ENCOUNTER — Ambulatory Visit: Payer: 59 | Admitting: Family

## 2022-06-09 VITALS — BP 126/85 | HR 69 | Temp 98.0°F | Resp 16 | Wt 156.0 lb

## 2022-06-09 DIAGNOSIS — E119 Type 2 diabetes mellitus without complications: Secondary | ICD-10-CM | POA: Diagnosis not present

## 2022-06-09 DIAGNOSIS — R030 Elevated blood-pressure reading, without diagnosis of hypertension: Secondary | ICD-10-CM

## 2022-06-09 DIAGNOSIS — R809 Proteinuria, unspecified: Secondary | ICD-10-CM | POA: Diagnosis not present

## 2022-06-09 LAB — COMPREHENSIVE METABOLIC PANEL
ALT: 10 U/L (ref 0–35)
AST: 12 U/L (ref 0–37)
Albumin: 4.3 g/dL (ref 3.5–5.2)
Alkaline Phosphatase: 87 U/L (ref 39–117)
BUN: 11 mg/dL (ref 6–23)
CO2: 26 mEq/L (ref 19–32)
Calcium: 9.3 mg/dL (ref 8.4–10.5)
Chloride: 105 mEq/L (ref 96–112)
Creatinine, Ser: 0.74 mg/dL (ref 0.40–1.20)
GFR: 97.18 mL/min (ref >60.00)
Glucose, Bld: 101 mg/dL — ABNORMAL HIGH (ref 70–99)
Potassium: 3.7 mEq/L (ref 3.5–5.1)
Sodium: 140 mEq/L (ref 135–145)
Total Bilirubin: 0.6 mg/dL (ref 0.2–1.2)
Total Protein: 6.9 g/dL (ref 6.0–8.3)

## 2022-06-09 LAB — HEMOGLOBIN A1C: Hgb A1c MFr Bld: 6 % (ref 4.6–6.5)

## 2022-06-09 MED ORDER — ATORVASTATIN CALCIUM 10 MG PO TABS: 10.0000 mg | ORAL_TABLET | Freq: Every day | ORAL | 1 refills | Status: DC

## 2022-06-09 MED ORDER — LISINOPRIL 2.5 MG PO TABS: 2.5000 mg | ORAL_TABLET | Freq: Every day | ORAL | 1 refills | Status: DC

## 2022-06-09 NOTE — Assessment & Plan Note (Addendum)
BP Readings from Last 3 Encounters:  06/09/22 126/85  02/07/22 129/81  01/09/22 128/77   BP stable without medication.

## 2022-06-09 NOTE — Assessment & Plan Note (Signed)
Maintained on lisinopril.

## 2022-06-09 NOTE — Assessment & Plan Note (Addendum)
Lab Results  Component Value Date   HGBA1C 6.0 02/07/2022   HGBA1C 6.0 10/07/2021   HGBA1C 6.1 06/24/2021   Lab Results  Component Value Date   MICROALBUR 5.2 (H) 10/07/2021   LDLCALC 38 10/07/2021   CREATININE 0.77 02/07/2022   Clinically stable. Obtain A1C.  Continue diabetic diet.   Wt Readings from Last 3 Encounters:  06/09/22 156 lb (70.8 kg)  02/07/22 154 lb 9.6 oz (70.1 kg)  01/09/22 157 lb (71.2 kg)

## 2022-06-09 NOTE — Progress Notes (Signed)
Subjective:   By signing my name below, I, Rose Hansen, attest that this documentation has been prepared under the direction and in the presence of Rose Craze, NP. 06/09/2022   Patient ID: Rose Hansen, female    DOB: 1975-10-25, 47 y.o.   MRN: [id number]  Chief Complaint  Patient presents with   Diabetes    Here for follow up   Hyperlipidemia    Here for follow up    Patient is in today for a follow up visit.   Diabetes: Her last A1c was 6.0. She is not taking any medication to control her blood sugar at this time. She is managing a healthy diet at this time. Her weight is stable since last visit.  Lab Results  Component Value Date   HGBA1C 6.0 02/07/2022   Blood pressure: Her blood pressure is doing well during this visit. She continues taking 2.5 mg lisinopril for kidney protection.  BP Readings from Last 3 Encounters:  06/09/22 126/85  02/07/22 129/81  01/09/22 128/77   Pulse Readings from Last 3 Encounters:  06/09/22 69  02/07/22 77  10/07/21 79   Mammogram: She [facility]comm[facility]    Immunizations: She does not have the latest Covid-19 booster vaccine at this time.    Past Medical History:  Diagnosis Date   Abnormal glucose    Circadian rhythm sleep disorder, shift work type    Diabetes type 2, controlled (HCC) 08/15/2016   Fatty liver 08/22/2016   Fibromyalgia    History of UTI    Hyperlipidemia    Mild anemia    Nonspecific reaction to tuberculin skin test without active tuberculosis(795.51)     Past Surgical History:  Procedure Laterality Date   ABDOMINAL HYSTERECTOMY N/A 12/07/2018   gyn surgery  08/2007   Dr. Jennette Kettle for benign cystic teratomas of right ovary,uterine fibroids, follicular cyst of left ovary   WISDOM TOOTH EXTRACTION      Family History  Problem Relation Age of Onset   Alzheimer's disease Mother 26       end stage dementia   Hypertension Father         (father was adopted)   Colon polyps Father    Asthma Sister    Hypertension Sister    Diabetes Mellitus II Maternal Grandmother    CVA Maternal Grandmother    Colon cancer Maternal Grandfather 2043-10-02   Diabetes Mellitus II Maternal Grandfather    Esophageal cancer Neg Hx    Stomach cancer Neg Hx    Rectal cancer Neg Hx     Social History   Socioeconomic History   Marital status: Single    Spouse name: Not on file   Number of children: 0   Years of education: Not on file   Highest education level: Not on file  Occupational History   Occupation: dialysis nurse at [employer]  Tobacco Use   Smoking status: Never   Smokeless tobacco: Never  Vaping Use   Vaping Use: Never used  Substance and Sexual Activity   Alcohol use: No   Drug use: No   Sexual activity: Not Currently  Other Topics Concern   Not on file  Social History Narrative   Mom- alzheimers- passed away 01-Oct-2017   Single   Dialysis RN- clinical coordinator (works days)    Completed college   No children   Enjoys reading and spending time with nieces and nephews  Social Determinants of Health   Financial Resource Strain: Not on file  Food Insecurity: Not on file  Transportation Needs: Not on file  Physical Activity: Not on file  Stress: Not on file  Social Connections: Not on file  Intimate Partner Violence: Not on file    Outpatient Medications Prior to Visit  Medication Sig Dispense Refill   atorvastatin (LIPITOR) 10 MG tablet Take 1 tablet (10 mg total) by mouth daily. 90 tablet 1   lisinopril (ZESTRIL) 2.5 MG tablet TAKE 1 TABLET BY MOUTH EVERY DAY 90 tablet 1   benzonatate (TESSALON) 100 MG capsule Take 1 capsule (100 mg total) by mouth 3 (three) times daily as needed. 30 capsule 0   No facility-administered medications prior to visit.    Allergies  Allergen Reactions   Pylera [Bis Subcit-Metronid-Tetracyc] Swelling    Swelling of face, eyes, lips.   Bismuth-Containing Compounds Swelling    Metronidazole Swelling   Tetracyclines & Related Swelling    ROS See HPI    Objective:    Physical Exam Constitutional:      General: She is not in acute distress.    Appearance: Normal appearance. She is not ill-appearing.  HENT:     Head: Normocephalic and atraumatic.     Right Ear: External ear normal.     Left Ear: External ear normal.  Eyes:     Extraocular Movements: Extraocular movements intact.     Pupils: Pupils are equal, round, and reactive to light.  Cardiovascular:     Rate and Rhythm: Normal rate and regular rhythm.     Heart sounds: Normal heart sounds. No murmur heard.    No gallop.  Pulmonary:     Effort: Pulmonary effort is normal. No respiratory distress.     Breath sounds: Normal breath sounds. No wheezing or rales.  Skin:    General: Skin is warm and dry.  Neurological:     Mental Status: She is alert and oriented to person, place, and time.  Psychiatric:        Judgment: Judgment normal.     BP 126/85 (BP Location: Right Arm, Patient Position: Sitting, Cuff Size: Small)   Pulse 69   Temp 98 F (36.7 C) (Oral)   Resp 16   Wt 156 lb (70.8 kg)   LMP 11/22/2018   SpO2 100%   BMI 31.51 kg/m  Wt Readings from Last 3 Encounters:  06/09/22 156 lb (70.8 kg)  02/07/22 154 lb 9.6 oz (70.1 kg)  01/09/22 157 lb (71.2 kg)       Assessment & Plan:  Controlled type 2 diabetes mellitus without complication, without long-term current use of insulin (HCC) Assessment & Plan: Lab Results  Component Value Date   HGBA1C 6.0 02/07/2022   HGBA1C 6.0 10/07/2021   HGBA1C 6.1 06/24/2021   Lab Results  Component Value Date   MICROALBUR 5.2 (H) 10/07/2021   LDLCALC 38 10/07/2021   CREATININE 0.77 02/07/2022   Clinically stable. Obtain A1C.  Continue diabetic diet.   Wt Readings from Last 3 Encounters:  06/09/22 156 lb (70.8 kg)  02/07/22 154 lb 9.6 oz (70.1 kg)  01/09/22 157 lb (71.2 kg)     Orders: -     Hemoglobin A1c -     Comprehensive  metabolic panel  Elevated blood pressure reading Assessment & Plan: BP Readings from Last 3 Encounters:  06/09/22 126/85  02/07/22 129/81  01/09/22 128/77   BP stable without medication.    Microalbuminuria Assessment &  Plan: Maintained on lisinopril.    Other orders -     Lisinopril; Take 1 tablet (2.5 mg total) by mouth daily.  Dispense: 90 tablet; Refill: 1 -     Atorvastatin Calcium; Take 1 tablet (10 mg total) by mouth daily.  Dispense: 90 tablet; Refill: 1    I, Nance Pear, NP, personally preformed the services described in this documentation.  All medical record entries made by the scribe were at my direction and in my presence.  I have reviewed the chart and discharge instructions (if applicable) and agree that the record reflects my personal performance and is accurate and complete. 06/09/2022   I,Rose Hansen,acting as a Education administrator for Nance Pear, NP.,have documented all relevant documentation on the behalf of Nance Pear, NP,as directed by  Nance Pear, NP while in the presence of Nance Pear, NP.   Nance Pear, NP

## 2022-06-30 LAB — HM MAMMOGRAPHY

## 2022-07-02 LAB — HM PAP SMEAR

## 2022-08-05 ENCOUNTER — Telehealth: Payer: Self-pay | Admitting: Family

## 2022-08-05 NOTE — Telephone Encounter (Addendum)
Can [facility] please call [facility] to request [facility]

## 2022-08-05 NOTE — Telephone Encounter (Signed)
Records release faxed 

## 2022-08-12 LAB — HM DIABETES EYE EXAM

## 2022-10-06 ENCOUNTER — Ambulatory Visit: Payer: 59 | Admitting: Family

## 2022-10-06 VITALS — BP 127/83 | HR 94 | Temp 98.1°F | Resp 16 | Wt 144.0 lb

## 2022-10-06 DIAGNOSIS — Z862 Personal history of diseases of the blood and blood-forming organs and certain disorders involving the immune mechanism: Secondary | ICD-10-CM | POA: Diagnosis not present

## 2022-10-06 DIAGNOSIS — R030 Elevated blood-pressure reading, without diagnosis of hypertension: Secondary | ICD-10-CM

## 2022-10-06 DIAGNOSIS — E785 Hyperlipidemia, unspecified: Secondary | ICD-10-CM

## 2022-10-06 DIAGNOSIS — E119 Type 2 diabetes mellitus without complications: Secondary | ICD-10-CM | POA: Diagnosis not present

## 2022-10-06 DIAGNOSIS — E1169 Type 2 diabetes mellitus with other specified complication: Secondary | ICD-10-CM

## 2022-10-06 DIAGNOSIS — J069 Acute upper respiratory infection, unspecified: Secondary | ICD-10-CM

## 2022-10-06 LAB — CBC WITH DIFFERENTIAL/PLATELET
Basophils Absolute: 0 10*3/uL (ref 0.0–0.1)
Basophils Relative: 0.8 % (ref 0.0–3.0)
Eosinophils Absolute: 0.4 10*3/uL (ref 0.0–0.7)
Eosinophils Relative: 8.2 % — ABNORMAL HIGH (ref 0.0–5.0)
HCT: 37.1 % (ref 36.0–46.0)
Hemoglobin: 12.3 g/dL (ref 12.0–15.0)
Lymphocytes Relative: 42.1 % (ref 12.0–46.0)
Lymphs Abs: 2.1 10*3/uL (ref 0.7–4.0)
MCHC: 33.1 g/dL (ref 30.0–36.0)
MCV: 85.4 fl (ref 78.0–100.0)
Monocytes Absolute: 0.6 10*3/uL (ref 0.1–1.0)
Monocytes Relative: 11.6 % (ref 3.0–12.0)
Neutro Abs: 1.8 10*3/uL (ref 1.4–7.7)
Neutrophils Relative %: 37.3 % — ABNORMAL LOW (ref 43.0–77.0)
Platelets: 302 10*3/uL (ref 150.0–400.0)
RBC: 4.34 Mil/uL (ref 3.87–5.11)
RDW: 13.7 % (ref 11.5–15.5)
WBC: 4.9 10*3/uL (ref 4.0–10.5)

## 2022-10-06 LAB — COMPREHENSIVE METABOLIC PANEL
ALT: 12 U/L (ref 0–35)
AST: 15 U/L (ref 0–37)
Albumin: 4.5 g/dL (ref 3.5–5.2)
Alkaline Phosphatase: 90 U/L (ref 39–117)
BUN: 12 mg/dL (ref 6–23)
CO2: 27 mEq/L (ref 19–32)
Calcium: 9.7 mg/dL (ref 8.4–10.5)
Chloride: 105 mEq/L (ref 96–112)
Creatinine, Ser: 0.96 mg/dL (ref 0.40–1.20)
GFR: 70.95 mL/min (ref >60.00)
Glucose, Bld: 114 mg/dL — ABNORMAL HIGH (ref 70–99)
Potassium: 3.9 mEq/L (ref 3.5–5.1)
Sodium: 143 mEq/L (ref 135–145)
Total Bilirubin: 0.7 mg/dL (ref 0.2–1.2)
Total Protein: 7.2 g/dL (ref 6.0–8.3)

## 2022-10-06 LAB — LIPID PANEL
Cholesterol: 111 mg/dL (ref 0–200)
HDL: 57.7 mg/dL (ref >39.00)
LDL Cholesterol: 46 mg/dL (ref 0–99)
NonHDL: 53.12
Total CHOL/HDL Ratio: 2
Triglycerides: 35 mg/dL (ref 0.0–149.0)
VLDL: 7 mg/dL (ref 0.0–40.0)

## 2022-10-06 LAB — HEMOGLOBIN A1C: Hgb A1c MFr Bld: 6.1 % (ref 4.6–6.5)

## 2022-10-06 MED ORDER — ATORVASTATIN CALCIUM 10 MG PO TABS: 10.0000 mg | ORAL_TABLET | Freq: Every day | ORAL | 1 refills | Status: DC

## 2022-10-06 MED ORDER — LISINOPRIL 2.5 MG PO TABS: 2.5000 mg | ORAL_TABLET | Freq: Every day | ORAL | 1 refills | Status: DC

## 2022-10-06 NOTE — Assessment & Plan Note (Addendum)
Diet controlled.  A1C has been stable. Update A1C. Continue lisinopril for microalbuminuria.

## 2022-10-06 NOTE — Assessment & Plan Note (Addendum)
New.  Rapid covid testing performed today. Plan supportive measures.

## 2022-10-06 NOTE — Assessment & Plan Note (Signed)
BP Readings from Last 3 Encounters:  10/06/22 127/83  06/09/22 126/85  02/07/22 129/81   BP looks stable.

## 2022-10-06 NOTE — Progress Notes (Signed)
Subjective:     Patient ID: Rose Hansen, female    DOB: [birth year], 47 y.o.   MRN: [id number]  No chief complaint on file.   HPI Patient is in today for follow up.   URI- She complains of nasal congestion, cough and sore throat which began on 5/4.  She notes that these symptoms are not what brought her here today.  DM2-  Lab Results  Component Value Date   HGBA1C 6.0 06/09/2022   HGBA1C 6.0 02/07/2022   HGBA1C 6.0 10/07/2021   Lab Results  Component Value Date   MICROALBUR 5.2 (H) 10/07/2021   LDLCALC 38 10/07/2021   CREATININE 0.74 06/09/2022   Hyperlipidemia- maintained on atorvastatin.  Lab Results  Component Value Date   CHOL 103 10/07/2021   HDL 56.40 10/07/2021   LDLCALC 38 10/07/2021   TRIG 47.0 10/07/2021   CHOLHDL 2 10/07/2021   Anemia-  Lab Results  Component Value Date   WBC 8.1 12/16/2017   HGB 12.3 12/16/2017   HCT 36.9 12/16/2017   MCV 87.9 12/16/2017   PLT 336.0 12/16/2017    Health Maintenance Due  Topic Date Due   COVID-19 Vaccine (4 - 2023-24 season) 01/31/2022   Diabetic kidney evaluation - Urine ACR  10/08/2022    Past Medical History:  Diagnosis Date   Abnormal glucose    Circadian rhythm sleep disorder, shift work type    Diabetes type 2, controlled (HCC) 08/15/2016   Fatty liver 08/22/2016   Fibromyalgia    History of UTI    Hyperlipidemia    Mild anemia    Nonspecific reaction to tuberculin skin test without active tuberculosis(795.51)     Past Surgical History:  Procedure Laterality Date   ABDOMINAL HYSTERECTOMY N/A 12/07/2018   gyn surgery  08/2007   Dr. Jennette Kettle for benign cystic teratomas of right ovary,uterine fibroids, follicular cyst of left ovary   WISDOM TOOTH EXTRACTION      Family History  Problem Relation Age of Onset   Alzheimer's disease Mother 23       end stage dementia   Hypertension Father        (father was adopted)   Colon polyps Father    Asthma Sister    Hypertension Sister     Diabetes Mellitus II Maternal Grandmother    CVA Maternal Grandmother    Colon cancer Maternal Grandfather 11/03/43   Diabetes Mellitus II Maternal Grandfather    Esophageal cancer Neg Hx    Stomach cancer Neg Hx    Rectal cancer Neg Hx     Social History   Socioeconomic History   Marital status: Single    Spouse name: Not on file   Number of children: 0   Years of education: Not on file   Highest education level: Not on file  Occupational History   Occupation: dialysis nurse at [employer]  Tobacco Use   Smoking status: Never   Smokeless tobacco: Never  Vaping Use   Vaping Use: Never used  Substance and Sexual Activity   Alcohol use: No   Drug use: No   Sexual activity: Not Currently  Other Topics Concern   Not on file  Social History Narrative   Mom- alzheimers- passed away Nov 02, 2017   Single   Dialysis RN- clinical coordinator (works days)    Completed college   No children   Enjoys reading and spending time with nieces and nephews   Social Determinants of Health   Financial Resource Strain: Not  on file  Food Insecurity: Not on file  Transportation Needs: Not on file  Physical Activity: Not on file  Stress: Not on file  Social Connections: Not on file  Intimate Partner Violence: Not on file    Outpatient Medications Prior to Visit  Medication Sig Dispense Refill   atorvastatin (LIPITOR) 10 MG tablet Take 1 tablet (10 mg total) by mouth daily. 90 tablet 1   lisinopril (ZESTRIL) 2.5 MG tablet Take 1 tablet (2.5 mg total) by mouth daily. 90 tablet 1   No facility-administered medications prior to visit.    Allergies  Allergen Reactions   Pylera [Bis Subcit-Metronid-Tetracyc] Swelling    Swelling of face, eyes, lips.   Bismuth-Containing Compounds Swelling   Metronidazole Swelling   Tetracyclines & Related Swelling    ROS See HPI    Objective:    Physical Exam Constitutional:      General: She is not in acute distress.    Appearance: Normal appearance. She  is well-developed.  HENT:     Head: Normocephalic and atraumatic.     Right Ear: External ear normal.     Left Ear: External ear normal.  Eyes:     General: No scleral icterus. Neck:     Thyroid: No thyromegaly.  Cardiovascular:     Rate and Rhythm: Normal rate and regular rhythm.     Heart sounds: Normal heart sounds. No murmur heard. Pulmonary:     Effort: Pulmonary effort is normal. No respiratory distress.     Breath sounds: Normal breath sounds. No wheezing.  Musculoskeletal:     Cervical back: Neck supple.  Skin:    General: Skin is warm and dry.  Neurological:     Mental Status: She is alert and oriented to person, place, and time.  Psychiatric:        Mood and Affect: Mood normal.        Behavior: Behavior normal.        Thought Content: Thought content normal.        Judgment: Judgment normal.    Diabetic Foot Exam - Simple   Simple Foot Form Diabetic Foot exam was performed with the following findings: Yes 10/06/2022  7:19 AM  Visual Inspection No deformities, no ulcerations, no other skin breakdown bilaterally: Yes Sensation Testing Intact to touch and monofilament testing bilaterally: Yes Pulse Check Posterior Tibialis and Dorsalis pulse intact bilaterally: Yes Comments     BP 127/83 (BP Location: Right Arm, Patient Position: Sitting, Cuff Size: Small)   Pulse 94   Temp 98.1 F (36.7 C) (Oral)   Resp 16   Wt 144 lb (65.3 kg)   LMP 11/22/2018   SpO2 100%   BMI 29.08 kg/m  Wt Readings from Last 3 Encounters:  10/06/22 144 lb (65.3 kg)  06/09/22 156 lb (70.8 kg)  02/07/22 154 lb 9.6 oz (70.1 kg)       Assessment & Plan:   Problem List Items Addressed This Visit       Unprioritized   Viral upper respiratory tract infection    New.  Rapid covid testing performed today. Plan supportive measures.       Type 2 diabetes mellitus with other specified complication (HCC) - Primary    Diet controlled.  A1C has been stable. Update A1C. Continue  lisinopril for microalbuminuria.       Relevant Medications   lisinopril (ZESTRIL) 2.5 MG tablet   atorvastatin (LIPITOR) 10 MG tablet   Elevated blood pressure reading  BP Readings from Last 3 Encounters:  10/06/22 127/83  06/09/22 126/85  02/07/22 129/81  BP looks stable.       ANEMIA, MILD, HX OF   Relevant Orders   CBC w/Diff   Other Visit Diagnoses     Hyperlipidemia, unspecified hyperlipidemia type       Relevant Medications   lisinopril (ZESTRIL) 2.5 MG tablet   atorvastatin (LIPITOR) 10 MG tablet   Other Relevant Orders   Lipid panel       I am having Rose Hansen maintain her lisinopril and atorvastatin.  Meds ordered this encounter  Medications   lisinopril (ZESTRIL) 2.5 MG tablet    Sig: Take 1 tablet (2.5 mg total) by mouth daily.    Dispense:  90 tablet    Refill:  1    Order Specific Question:   Supervising Provider    Answer:   Danise Edge A [[id number]]   atorvastatin (LIPITOR) 10 MG tablet    Sig: Take 1 tablet (10 mg total) by mouth daily.    Dispense:  90 tablet    Refill:  1    Order Specific Question:   Supervising Provider    Answer:   Danise Edge A [[id number]]

## 2023-01-15 ENCOUNTER — Encounter (INDEPENDENT_AMBULATORY_CARE_PROVIDER_SITE_OTHER): Payer: Self-pay

## 2023-02-06 ENCOUNTER — Ambulatory Visit: Payer: 59 | Admitting: Family

## 2023-02-06 ENCOUNTER — Ambulatory Visit (HOSPITAL_BASED_OUTPATIENT_CLINIC_OR_DEPARTMENT_OTHER)
Admission: RE | Admit: 2023-02-06 | Discharge: 2023-02-06 | Disposition: A | Discharge status: Home / Self Care | Payer: 59 | Source: Ambulatory Visit | Attending: Family | Admitting: Family

## 2023-02-06 VITALS — BP 122/80 | HR 78 | Temp 98.0°F | Resp 18 | Ht 59.0 in | Wt 151.2 lb

## 2023-02-06 DIAGNOSIS — Z9289 Personal history of other medical treatment: Secondary | ICD-10-CM | POA: Insufficient documentation

## 2023-02-06 DIAGNOSIS — Z0184 Encounter for antibody response examination: Secondary | ICD-10-CM | POA: Diagnosis not present

## 2023-02-06 DIAGNOSIS — E119 Type 2 diabetes mellitus without complications: Secondary | ICD-10-CM

## 2023-02-06 DIAGNOSIS — E785 Hyperlipidemia, unspecified: Secondary | ICD-10-CM | POA: Diagnosis not present

## 2023-02-06 DIAGNOSIS — Z23 Encounter for immunization: Secondary | ICD-10-CM

## 2023-02-06 DIAGNOSIS — E1169 Type 2 diabetes mellitus with other specified complication: Secondary | ICD-10-CM

## 2023-02-06 LAB — MICROALBUMIN / CREATININE URINE RATIO
Creatinine,U: 19.6 mg/dL
Microalb Creat Ratio: 3.6 mg/g (ref 0.0–30.0)
Microalb, Ur: <0.7 mg/dL (ref 0.0–1.9)

## 2023-02-06 LAB — BASIC METABOLIC PANEL
BUN: 13 mg/dL (ref 6–23)
CO2: 27 meq/L (ref 19–32)
Calcium: 9.8 mg/dL (ref 8.4–10.5)
Chloride: 103 meq/L (ref 96–112)
Creatinine, Ser: 0.81 mg/dL (ref 0.40–1.20)
GFR: 86.79 mL/min (ref >60.00)
Glucose, Bld: 100 mg/dL — ABNORMAL HIGH (ref 70–99)
Potassium: 3.9 meq/L (ref 3.5–5.1)
Sodium: 139 meq/L (ref 135–145)

## 2023-02-06 LAB — HEMOGLOBIN A1C: Hgb A1c MFr Bld: 5.8 % (ref 4.6–6.5)

## 2023-02-06 NOTE — Patient Instructions (Signed)
VISIT SUMMARY:  During your recent visit, we discussed your overall health and conducted routine lab work required for school. You reported a mild headache but otherwise felt well. We reviewed your medical history, including a positive PPD test in 2011, diabetes, and hypertension. Your blood pressure was slightly elevated during the visit.  YOUR PLAN:  -POSITIVE PPD: You had a positive PPD test in 2011, which indicates exposure to tuberculosis. You completed treatment and currently have no symptoms. To ensure you don't have active tuberculosis, we will order a chest x-ray and update a TB Gold test for your school form.  -DIABETES: You have diabetes but reported no current symptoms. To monitor your disease control, we will order an A1c test and a urine microalbumin test.  -HYPERTENSION: You have high blood pressure, which was slightly elevated during the visit. We will monitor your blood pressure and may consider increasing your Lisinopril dosage if necessary.  -IMMUNIZATION STATUS: You had Hep B series and chickenpox in childhood. To confirm your immunity, we will order titers for MMR, Hep B, and Varicella. We also administered a flu shot during your visit and recommend getting a COVID booster at a pharmacy when available.  INSTRUCTIONS:  Please follow up in 4 months. In the meantime, monitor your blood pressure and blood sugar levels regularly. If you notice any changes or have any concerns, please contact our office immediately.

## 2023-02-06 NOTE — Assessment & Plan Note (Signed)
A1C at goal.  Diet controlled.   No current symptoms reported. -Order A1c and urine microalbumin to monitor disease control.

## 2023-02-06 NOTE — Assessment & Plan Note (Signed)
Lab Results  Component Value Date   CHOL 111 10/06/2022   HDL 57.70 10/06/2022   LDLCALC 46 10/06/2022   TRIG 35.0 10/06/2022   CHOLHDL 2 10/06/2022   LDL at goal. Continue atorvastatin.

## 2023-02-06 NOTE — Progress Notes (Signed)
Subjective:     Patient ID: Rose Hansen, female    DOB: 1976/02/08, 47 y.o.   MRN: [id number]  Chief Complaint  Patient presents with   Follow-up    HPI  Discussed the use of AI scribe software for clinical note transcription with the patient, who gave verbal consent to proceed.  History of Present Illness   The patient presents for routine follow up and lab work required for school. 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 She reports a mild headache but otherwise feels well. She has a history of a positive PPD test in 2011 and completed treatment with Dr. Kriste Basque. She also has a history of diabetes and is on a small dose of Lisinopril for microalbuminuria.  She reports no current symptoms related to these conditions.      DM2-  Lab Results  Component Value Date   HGBA1C 6.1 10/06/2022   HGBA1C 6.0 06/09/2022   HGBA1C 6.0 02/07/2022   Lab Results  Component Value Date   MICROALBUR 5.2 (H) 10/07/2021   LDLCALC 46 10/06/2022   CREATININE 0.96 10/06/2022    Hyperlipidemia- maintained on lipitor.  Lab Results  Component Value Date   CHOL 111 10/06/2022   HDL 57.70 10/06/2022   LDLCALC 46 10/06/2022   TRIG 35.0 10/06/2022   CHOLHDL 2 10/06/2022   Hx of anemia-  Lab Results  Component Value Date   WBC 4.9 10/06/2022   HGB 12.3 10/06/2022   HCT 37.1 10/06/2022   MCV 85.4 10/06/2022   PLT 302.0 10/06/2022       Health Maintenance Due  Topic Date Due   Diabetic kidney evaluation - Urine ACR  10/08/2022   COVID-19 Vaccine (7 - 2023-24 season) 02/01/2023   PAP SMEAR-Modifier  01/26/2023    Past Medical History:  Diagnosis Date   Abnormal glucose    Circadian rhythm sleep disorder, shift work type    Diabetes type 2, controlled (HCC) 08/15/2016   Fatty liver 08/22/2016   Fibromyalgia    History of UTI    Hyperlipidemia    Mild anemia    Nonspecific reaction to tuberculin skin test without active  tuberculosis(795.51)     Past Surgical History:  Procedure Laterality Date   ABDOMINAL HYSTERECTOMY N/A 12/07/2018   gyn surgery  08/2007   Dr. Jennette Kettle for benign cystic teratomas of right ovary,uterine fibroids, follicular cyst of left ovary   WISDOM TOOTH EXTRACTION      Family History  Problem Relation Age of Onset   Alzheimer's disease Mother 70       end stage dementia   Hypertension Father        (father was adopted)   Colon polyps Father    Asthma Sister    Hypertension Sister    Diabetes Mellitus II Maternal Grandmother    CVA Maternal Grandmother    Colon cancer Maternal Grandfather 45   Diabetes Mellitus II Maternal Grandfather    Esophageal cancer Neg Hx    Stomach cancer Neg Hx    Rectal cancer Neg Hx     Social History   Socioeconomic History   Marital status: Single    Spouse name: Not on file   Number of children: 0   Years of education: Not on file   Highest education level: Bachelor's degree (e.g., BA, AB, BS)  Occupational History   Occupation: dialysis nurse at [employer]  Tobacco Use   Smoking status: Never  Smokeless tobacco: Never  Vaping Use   Vaping status: Never Used  Substance and Sexual Activity   Alcohol use: No   Drug use: No   Sexual activity: Not Currently  Other Topics Concern   Not on file  Social History Narrative   Mom- alzheimers- passed away 02-20-2018   Single   Dialysis RN- clinical coordinator (works days)    Completed college   No children   Enjoys reading and spending time with nieces and nephews   Social Determinants of Health   Financial Resource Strain: Low Risk  (02/05/2023)   Overall Financial Resource Strain (CARDIA)    Difficulty of Paying Living Expenses: Not hard at all  Food Insecurity: No Food Insecurity (02/05/2023)   Hunger Vital Sign    Worried About Running Out of Food in the Last Year: Never true    Ran Out of Food in the Last Year: Never true  Transportation Needs: No Transportation Needs (02/05/2023)    PRAPARE - Administrator, Civil Service (Medical): No    Lack of Transportation (Non-Medical): No  Physical Activity: Insufficiently Active (02/05/2023)   Exercise Vital Sign    Days of Exercise per Week: 3 days    Minutes of Exercise per Session: 30 min  Stress: No Stress Concern Present (02/05/2023)   [employer] of Occupational Health - Occupational Stress Questionnaire    Feeling of Stress : Not at all  Social Connections: Moderately Isolated (02/05/2023)   Social Connection and Isolation Panel [NHANES]    Frequency of Communication with Friends and Family: Three times a week    Frequency of Social Gatherings with Friends and Family: Twice a week    Attends Religious Services: More than 4 times per year    Active Member of [employer] or Organizations: No    Attends Engineer, structural: Not on file    Marital Status: Never married  Intimate Partner Violence: Not on file    Outpatient Medications Prior to Visit  Medication Sig Dispense Refill   atorvastatin (LIPITOR) 10 MG tablet Take 1 tablet (10 mg total) by mouth daily. 90 tablet 1   lisinopril (ZESTRIL) 2.5 MG tablet Take 1 tablet (2.5 mg total) by mouth daily. 90 tablet 1   No facility-administered medications prior to visit.    Allergies  Allergen Reactions   Pylera [Bis Subcit-Metronid-Tetracyc] Swelling    Swelling of face, eyes, lips.   Bismuth-Containing Compounds Swelling   Metronidazole Swelling   Tetracyclines & Related Swelling    ROS See HPI    Objective:    Physical Exam Constitutional:      General: She is not in acute distress.    Appearance: Normal appearance. She is well-developed.  HENT:     Head: Normocephalic and atraumatic.     Right Ear: External ear normal.     Left Ear: External ear normal.  Eyes:     General: No scleral icterus. Neck:     Thyroid: No thyromegaly.  Cardiovascular:     Rate and Rhythm: Normal rate and regular rhythm.     Heart sounds: Normal heart  sounds. No murmur heard. Pulmonary:     Effort: Pulmonary effort is normal. No respiratory distress.     Breath sounds: Normal breath sounds. No wheezing.  Musculoskeletal:        General: No swelling.     Cervical back: Neck supple.  Skin:    General: Skin is warm and dry.  Neurological:  Mental Status: She is alert and oriented to person, place, and time.  Psychiatric:        Mood and Affect: Mood normal.        Behavior: Behavior normal.        Thought Content: Thought content normal.        Judgment: Judgment normal.      BP 122/80   Pulse 78   Temp 98 F (36.7 C)   Resp 18   Ht 4\' 11"  (1.499 m)   Wt 151 lb 3.2 oz (68.6 kg)   LMP 11/22/2018   SpO2 100%   BMI 30.54 kg/m  Wt Readings from Last 3 Encounters:  02/06/23 151 lb 3.2 oz (68.6 kg)  10/06/22 144 lb (65.3 kg)  06/09/22 156 lb (70.8 kg)       Assessment & Plan:   Problem List Items Addressed This Visit       Unprioritized   Type 2 diabetes mellitus with other specified complication (HCC)    A1C at goal.  Diet controlled.   No current symptoms reported. -Order A1c and urine microalbumin to monitor disease control.      Hyperlipidemia associated with type 2 diabetes mellitus (HCC)    Lab Results  Component Value Date   CHOL 111 10/06/2022   HDL 57.70 10/06/2022   LDLCALC 46 10/06/2022   TRIG 35.0 10/06/2022   CHOLHDL 2 10/06/2022   LDL at goal. Continue atorvastatin.       History of positive PPD    Was diagnosed some time back in 2011.  She underwent treatment at that time.  Will update CXR.  She needs documentation of + TB gold or PPD date for school.  I placed an updated TB gold.       Relevant Orders   DG Chest 2 View   QuantiFERON-TB Gold Plus   Other Visit Diagnoses     Immunity status testing    -  Primary   Relevant Orders   Measles/Mumps/Rubella Immunity   Hepatitis B surface antibody,quantitative   Varicella-Zoster Virus Antibody (Immunity Screen), ACIF ,Serum    Controlled type 2 diabetes mellitus without complication, without long-term current use of insulin (HCC)       Relevant Orders   Urine Microalbumin w/creat. ratio   HgB A1c   Basic Metabolic Panel (BMET)   Need for influenza vaccination       Relevant Orders   Flu vaccine trivalent PF, 6mos and older(Flulaval,Afluria,Fluarix,Fluzone) (Completed)       I am having Rose Hansen maintain her lisinopril and atorvastatin.  No orders of the defined types were placed in this encounter.

## 2023-02-06 NOTE — Assessment & Plan Note (Signed)
Was diagnosed some time back in 2011.  She underwent treatment at that time.  Will update CXR.  She needs documentation of + TB gold or PPD date for school.  I placed an updated TB gold.

## 2023-02-08 ENCOUNTER — Telehealth: Payer: Self-pay | Admitting: Family

## 2023-02-08 NOTE — Telephone Encounter (Signed)
See mychart.  

## 2023-02-08 NOTE — Telephone Encounter (Signed)
-----   Message from Nurse Cala Bradford B sent at 02/06/2023  9:01 AM EDT ----- Her hx is "abdominal hysterectomy". In order for her to be an exclusion for pap, she would need either "complete", "total" or "radical" hysterectomy. Do we know if she still has a cervix? I wasn't able to find this in her chart. ----- Message ----- From: [name], NP Sent: 02/06/2023   7:33 AM EDT To: Alysia Penna, RN  Here is an an example of a patient who has had hysterectomy (documented in surgical Hx) but is showing that she is overdue for pap. I haven't changed it yet because I wanted you to see it.

## 2023-02-09 ENCOUNTER — Encounter: Payer: Self-pay | Admitting: Family

## 2023-02-09 NOTE — Telephone Encounter (Signed)
I confirmed no cervix.  I changed to total hysterectomy. It is still showing pap overdue?

## 2023-02-12 LAB — MEASLES/MUMPS/RUBELLA IMMUNITY
Mumps IgG: 42.5 [AU]/ml
Rubella: 2.82 {index}
Rubeola IgG: 184 [AU]/ml

## 2023-02-12 LAB — QUANTIFERON-TB GOLD PLUS
Mitogen-NIL: >10 [IU]/mL
NIL: 0.04 [IU]/mL
QuantiFERON-TB Gold Plus: NEGATIVE
TB1-NIL: 0 [IU]/mL
TB2-NIL: <0 [IU]/mL

## 2023-02-12 LAB — HEPATITIS B SURFACE ANTIBODY, QUANTITATIVE: Hep B S AB Quant (Post): 135 m[IU]/mL (ref >=10)

## 2023-02-12 LAB — VARICELLA-ZOSTER VIRUS AB(IMMUNITY SCREEN),ACIF,SERUM: VARICELLA ZOSTER VIRUS AB (IMMUNITY SCR),ACIF SERUM: >=1:4 {titer}

## 2023-02-20 NOTE — Telephone Encounter (Signed)
Pt will be here on Monday to pick up paperwork.

## 2023-03-03 NOTE — Telephone Encounter (Signed)
FYI- Pt called to advise that paperwork needs to be filled out again and other documentation needs to be attached to the form. Pt said that she signed it but it has to be stamped instead. Pt dropping off new forms to be filled out.

## 2023-03-04 ENCOUNTER — Telehealth: Payer: Self-pay | Admitting: Family

## 2023-03-04 NOTE — Telephone Encounter (Signed)
Pt dropped off document to be filled out by provider [facility] Paperwork - In a large yellow envelope) Pt would like to be called when document ready to pick up.Pt tel [phone]. Document put at front office tray under providers name.

## 2023-03-05 NOTE — Telephone Encounter (Signed)
Form taken to the  back

## 2023-03-09 ENCOUNTER — Telehealth: Payer: Self-pay | Admitting: Family

## 2023-03-09 NOTE — Telephone Encounter (Signed)
Called patient a few times but no answer, lvm for her to call back for this

## 2023-03-09 NOTE — Telephone Encounter (Signed)
Patient reports she had vision test 9/06, information entered on form.  She will come in tomorrow for urine analysis

## 2023-03-09 NOTE — Telephone Encounter (Signed)
I was reviewing her forms and it looks like they are requiring a vision exam and urinalysis which have not been updated. Let's schedule a nurse visit for these please.

## 2023-03-10 ENCOUNTER — Other Ambulatory Visit: Payer: 59

## 2023-03-11 ENCOUNTER — Other Ambulatory Visit (INDEPENDENT_AMBULATORY_CARE_PROVIDER_SITE_OTHER): Payer: 59

## 2023-03-11 DIAGNOSIS — R3129 Other microscopic hematuria: Secondary | ICD-10-CM

## 2023-03-11 DIAGNOSIS — Z0000 Encounter for general adult medical examination without abnormal findings: Secondary | ICD-10-CM

## 2023-03-11 LAB — POC URINALSYSI DIPSTICK (AUTOMATED)
Glucose, UA: NEGATIVE
Glucose, UA: NEGATIVE
Ketones, UA: NEGATIVE
Nitrite, UA: NEGATIVE
Protein, UA: NEGATIVE
Spec Grav, UA: 1.01 (ref 1.010–1.025)
Urobilinogen, UA: 0.2 U/dL
pH, UA: 5 (ref 5.0–8.0)

## 2023-03-11 NOTE — Addendum Note (Signed)
Addended by: Mervin Kung A on: 03/11/2023 12:18 PM   Modules accepted: Orders

## 2023-03-12 LAB — URINE CULTURE
MICRO NUMBER:: 15572567
Result:: NO GROWTH
SPECIMEN QUALITY:: ADEQUATE

## 2023-03-12 NOTE — Telephone Encounter (Signed)
Forms completed, lvm for patient to pick up at front desk

## 2023-04-15 ENCOUNTER — Other Ambulatory Visit: Payer: Self-pay | Admitting: Family

## 2023-06-09 ENCOUNTER — Ambulatory Visit: Payer: 59 | Admitting: Family

## 2023-06-09 VITALS — BP 134/72 | HR 70 | Temp 98.6°F | Resp 16 | Ht 59.0 in | Wt 156.0 lb

## 2023-06-09 DIAGNOSIS — R030 Elevated blood-pressure reading, without diagnosis of hypertension: Secondary | ICD-10-CM | POA: Diagnosis not present

## 2023-06-09 DIAGNOSIS — E1169 Type 2 diabetes mellitus with other specified complication: Secondary | ICD-10-CM | POA: Diagnosis not present

## 2023-06-09 DIAGNOSIS — N951 Menopausal and female climacteric states: Secondary | ICD-10-CM | POA: Insufficient documentation

## 2023-06-09 DIAGNOSIS — E785 Hyperlipidemia, unspecified: Secondary | ICD-10-CM

## 2023-06-09 DIAGNOSIS — R809 Proteinuria, unspecified: Secondary | ICD-10-CM

## 2023-06-09 MED ORDER — LISINOPRIL 2.5 MG PO TABS: 2.5000 mg | ORAL_TABLET | Freq: Every day | ORAL | 1 refills | Status: DC

## 2023-06-09 NOTE — Assessment & Plan Note (Signed)
 Initial reading high today but follow up bp OK. Continue to monitor.

## 2023-06-09 NOTE — Patient Instructions (Signed)
 VISIT SUMMARY:  You came in today for a routine follow-up for your diabetes, hypertension, and hyperlipidemia. You mentioned that you have been following your diet and medication regimen well, but you have noticed a slight weight gain over the holidays. You also reported new symptoms of menopause, such as night sweats and hot flashes, and an increase in dental cavities.  YOUR PLAN:  -PREDIABETES: Prediabetes means your blood sugar levels are higher than normal but not high enough to be classified as diabetes. We will check your A1c and basic metabolic panel today to monitor your blood sugar levels.  -HYPERTENSION: Hypertension, or high blood pressure, can lead to serious health problems if not managed. Your blood pressure was slightly elevated today, so we will continue your current medication, Lisinopril , and recheck your blood pressure.  -HYPERLIPIDEMIA: Hyperlipidemia means you have high levels of fats (lipids) in your blood, which can increase your risk of heart disease. Your cholesterol levels were well controlled in May 2024, so we will continue your current medication, Lipitor, without needing a repeat cholesterol check today.  -PERI-MENOPAUSE: Peri-Menopause is a natural decline in reproductive hormones when a woman reaches her 72s or 71s. You reported night sweats and hot flashes. We discussed using cotton sheets and pajamas and keeping a glass of water by your bed. We can explore other treatments if your symptoms become unbearable.  -GENERAL HEALTH MAINTENANCE: We want to ensure your preventive care is up to date. We will refill your Lisinopril  prescription and plan a follow-up in 6 months if your A1c is within the normal range.  INSTRUCTIONS:  Please get your A1c and basic metabolic panel checked today. Continue taking your Lisinopril  and Lipitor as prescribed. Recheck your blood pressure as advised. Follow the suggestions for managing menopause symptoms. Schedule a follow-up appointment  in 6 months if your A1c levels are normal.

## 2023-06-09 NOTE — Assessment & Plan Note (Signed)
 She is s/p partial hysterectomy so she does not have periods. Reports night sweats and hot flashes. No vaginal dryness. -Advised on cotton sheets and pajamas, and keeping a glass of water by the bed. Discussed potential treatments if symptoms become unbearable.

## 2023-06-09 NOTE — Assessment & Plan Note (Signed)
 Lab Results  Component Value Date   CHOL 111 10/06/2022   HDL 57.70 10/06/2022   LDLCALC 46 10/06/2022   TRIG 35.0 10/06/2022   CHOLHDL 2 10/06/2022   LDL at goal on lipitor 10 mg continue same.

## 2023-06-09 NOTE — Progress Notes (Signed)
 Subjective:     Patient ID: Rose Hansen, female    DOB: 1975-12-20, 48 y.o.   MRN: [id number]  Chief Complaint  Patient presents with   Diabetes    Here for follow up    Diabetes    Discussed the use of AI scribe software for clinical note transcription with the patient, who gave verbal consent to proceed.  History of Present Illness   The patient, with a history of diabetes, hypertension, and hyperlipidemia, presents for a routine follow-up. She reports good adherence to her current regimen of diet control for diabetes, Lisinopril  for hypertension, and Lipitor for hyperlipidemia. She notes a slight weight gain over the holidays and anticipates a possible increase in her A1c.  The patient also reports new symptoms suggestive of menopause, including night sweats and hot flashes. She denies any changes in dietary habits but has noticed an increase in dental cavities, which she initially attributed to menopause.     Wt Readings from Last 3 Encounters:  06/09/23 156 lb (70.8 kg)  02/06/23 151 lb 3.2 oz (68.6 kg)  10/06/22 144 lb (65.3 kg)   Lab Results  Component Value Date   CHOL 111 10/06/2022   HDL 57.70 10/06/2022   LDLCALC 46 10/06/2022   TRIG 35.0 10/06/2022   CHOLHDL 2 10/06/2022    BP Readings from Last 3 Encounters:  06/09/23 134/72  02/06/23 122/80  10/06/22 127/83     Health Maintenance Due  Topic Date Due   COVID-19 Vaccine (7 - 2024-25 season) 02/01/2023    Past Medical History:  Diagnosis Date   Abnormal glucose    Circadian rhythm sleep disorder, shift work type    Diabetes type 2, controlled (HCC) 08/15/2016   Fatty liver 08/22/2016   Fibromyalgia    History of UTI    Hyperlipidemia    Mild anemia    Nonspecific reaction to tuberculin skin test without active tuberculosis(795.51)     Past Surgical History:  Procedure Laterality Date   ABDOMINAL HYSTERECTOMY N/A 12/07/2018   gyn surgery  08/2007   Dr. Rosalynn for benign cystic  teratomas of right ovary,uterine fibroids, follicular cyst of left ovary   TOTAL ABDOMINAL HYSTERECTOMY  12/07/2018   WISDOM TOOTH EXTRACTION      Family History  Problem Relation Age of Onset   Alzheimer's disease Mother 80       end stage dementia   Hypertension Father        (father was adopted)   Colon polyps Father    Asthma Sister    Hypertension Sister    Diabetes Mellitus II Maternal Grandmother    CVA Maternal Grandmother    Colon cancer Maternal Grandfather 45   Diabetes Mellitus II Maternal Grandfather    Esophageal cancer Neg Hx    Stomach cancer Neg Hx    Rectal cancer Neg Hx     Social History   Socioeconomic History   Marital status: Single    Spouse name: Not on file   Number of children: 0   Years of education: Not on file   Highest education level: Bachelor's degree (e.g., BA, AB, BS)  Occupational History   Occupation: dialysis nurse at [employer]  Tobacco Use   Smoking status: Never   Smokeless tobacco: Never  Vaping Use   Vaping status: Never Used  Substance and Sexual Activity   Alcohol use: No   Drug use: No   Sexual activity: Not Currently  Other Topics Concern   Not  on file  Social History Narrative   Mom- alzheimers- passed away May 14, 2018   Single   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)    Completed college   No children   Enjoys reading and spending time with nieces and nephews   Social Drivers of Corporate Investment Banker Strain: Low Risk  (06/09/2023)   Overall Financial Resource Strain (CARDIA)    Difficulty of Paying Living Expenses: Not hard at all  Food Insecurity: No Food Insecurity (06/09/2023)   Hunger Vital Sign    Worried About Running Out of Food in the Last Year: Never true    Ran Out of Food in the Last Year: Never true  Transportation Needs: No Transportation Needs (06/09/2023)   PRAPARE - Administrator, Civil Service (Medical): No    Lack of Transportation (Non-Medical): No  Physical Activity:  Insufficiently Active (06/09/2023)   Exercise Vital Sign    Days of Exercise per Week: 3 days    Minutes of Exercise per Session: 30 min  Stress: No Stress Concern Present (06/09/2023)   [employer] of Occupational Health - Occupational Stress Questionnaire    Feeling of Stress : Not at all  Social Connections: Moderately Isolated (06/09/2023)   Social Connection and Isolation Panel [NHANES]    Frequency of Communication with Friends and Family: More than three times a week    Frequency of Social Gatherings with Friends and Family: Twice a week    Attends Religious Services: More than 4 times per year    Active Member of [employer] or Organizations: No    Attends Engineer, Structural: Not on file    Marital Status: Never married  Intimate Partner Violence: Not on file    Outpatient Medications Prior to Visit  Medication Sig Dispense Refill   atorvastatin  (LIPITOR) 10 MG tablet TAKE 1 TABLET BY MOUTH EVERY DAY 90 tablet 1   lisinopril  (ZESTRIL ) 2.5 MG tablet Take 1 tablet (2.5 mg total) by mouth daily. 90 tablet 1   No facility-administered medications prior to visit.    Allergies  Allergen Reactions   Pylera [Bis Subcit-Metronid-Tetracyc] Swelling    Swelling of face, eyes, lips.   Bismuth-Containing Compounds Swelling   Metronidazole Swelling   Tetracyclines & Related Swelling    ROS See HPI    Objective:    Physical Exam Constitutional:      Appearance: Normal appearance. She is well-developed.  HENT:     Head: Normocephalic and atraumatic.  Cardiovascular:     Rate and Rhythm: Normal rate and regular rhythm.     Heart sounds: Normal heart sounds. No murmur heard. Pulmonary:     Effort: Pulmonary effort is normal. No respiratory distress.     Breath sounds: Normal breath sounds. No wheezing.  Musculoskeletal:        General: No swelling.  Neurological:     Mental Status: She is alert.  Psychiatric:        Behavior: Behavior normal.        Thought  Content: Thought content normal.        Judgment: Judgment normal.      BP 134/72   Pulse 70   Temp 98.6 F (37 C) (Oral)   Resp 16   Ht 4' 11 (1.499 m)   Wt 156 lb (70.8 kg)   LMP 11/22/2018   SpO2 99%   BMI 31.51 kg/m  Wt Readings from Last 3 Encounters:  06/09/23 156 lb (70.8 kg)  02/06/23  151 lb 3.2 oz (68.6 kg)  10/06/22 144 lb (65.3 kg)       Assessment & Plan:   Problem List Items Addressed This Visit       Unprioritized   Type 2 diabetes mellitus with other specified complication [facility]) - Primary   Lab Results  Component Value Date   HGBA1C 5.8 02/06/2023   HGBA1C 6.1 10/06/2022   HGBA1C 6.0 06/09/2022   Lab Results  Component Value Date   MICROALBUR <0.7 02/06/2023   LDLCALC 46 10/06/2022   CREATININE 0.81 02/06/2023   Last A1c was 5.8 on 02/06/2023. Recent weight gain over the holidays. -Check A1c and basic metabolic panel today.       Relevant Medications   lisinopril  (ZESTRIL ) 2.5 MG tablet   Other Relevant Orders   HgB A1c   Basic Metabolic Panel (BMET)   Perimenopause   She is s/p partial hysterectomy so she does not have periods. Reports night sweats and hot flashes. No vaginal dryness. -Advised on cotton sheets and pajamas, and keeping a glass of water by the bed. Discussed potential treatments if symptoms become unbearable.       Microalbuminuria   Continue lisinopril  for renal protection.       Hyperlipidemia associated with type 2 diabetes mellitus (HCC)   Lab Results  Component Value Date   CHOL 111 10/06/2022   HDL 57.70 10/06/2022   LDLCALC 46 10/06/2022   TRIG 35.0 10/06/2022   CHOLHDL 2 10/06/2022   LDL at goal on lipitor 10 mg continue same.       Relevant Medications   lisinopril  (ZESTRIL ) 2.5 MG tablet   Elevated blood pressure reading   Initial reading high today but follow up bp OK. Continue to monitor.        I am having Mckensey A. Mcinerney maintain her atorvastatin  and lisinopril .  Meds ordered this  encounter  Medications   lisinopril  (ZESTRIL ) 2.5 MG tablet    Sig: Take 1 tablet (2.5 mg total) by mouth daily.    Dispense:  90 tablet    Refill:  1    Supervising Provider:   DOMENICA BLACKBIRD A [[id number]]

## 2023-06-09 NOTE — Assessment & Plan Note (Addendum)
 Lab Results  Component Value Date   HGBA1C 5.8 02/06/2023   HGBA1C 6.1 10/06/2022   HGBA1C 6.0 06/09/2022   Lab Results  Component Value Date   MICROALBUR <0.7 02/06/2023   LDLCALC 46 10/06/2022   CREATININE 0.81 02/06/2023   Last A1c was 5.8 on 02/06/2023. Recent weight gain over the holidays. -Check A1c and basic metabolic panel today.

## 2023-06-09 NOTE — Assessment & Plan Note (Signed)
Continue lisinopril for renal protection 

## 2023-06-10 LAB — BASIC METABOLIC PANEL
BUN: 12 mg/dL (ref 6–23)
CO2: 28 meq/L (ref 19–32)
Calcium: 10 mg/dL (ref 8.4–10.5)
Chloride: 102 meq/L (ref 96–112)
Creatinine, Ser: 0.77 mg/dL (ref 0.40–1.20)
GFR: 92.01 mL/min (ref >60.00)
Glucose, Bld: 85 mg/dL (ref 70–99)
Potassium: 3.9 meq/L (ref 3.5–5.1)
Sodium: 140 meq/L (ref 135–145)

## 2023-06-10 LAB — HEMOGLOBIN A1C: Hgb A1c MFr Bld: 6.2 % (ref 4.6–6.5)

## 2023-08-21 LAB — HM DIABETES EYE EXAM

## 2023-09-04 LAB — HM MAMMOGRAPHY

## 2023-10-03 ENCOUNTER — Other Ambulatory Visit: Payer: Self-pay | Admitting: Family

## 2023-11-29 ENCOUNTER — Other Ambulatory Visit: Payer: Self-pay | Admitting: Family

## 2023-12-08 ENCOUNTER — Ambulatory Visit: Payer: 59 | Admitting: Family

## 2023-12-16 ENCOUNTER — Ambulatory Visit: Admitting: Family

## 2024-01-27 ENCOUNTER — Telehealth: Payer: Self-pay | Admitting: Family

## 2024-01-27 ENCOUNTER — Ambulatory Visit: Admitting: Family

## 2024-01-27 VITALS — BP 134/80 | HR 79 | Temp 98.4°F | Resp 16 | Ht 59.0 in | Wt 168.0 lb

## 2024-01-27 DIAGNOSIS — Z7985 Long-term (current) use of injectable non-insulin antidiabetic drugs: Secondary | ICD-10-CM | POA: Diagnosis not present

## 2024-01-27 DIAGNOSIS — E1169 Type 2 diabetes mellitus with other specified complication: Secondary | ICD-10-CM | POA: Diagnosis not present

## 2024-01-27 DIAGNOSIS — R809 Proteinuria, unspecified: Secondary | ICD-10-CM | POA: Diagnosis not present

## 2024-01-27 DIAGNOSIS — E785 Hyperlipidemia, unspecified: Secondary | ICD-10-CM

## 2024-01-27 LAB — COMPREHENSIVE METABOLIC PANEL WITH GFR
ALT: 19 U/L (ref 0–35)
AST: 17 U/L (ref 0–37)
Albumin: 4.6 g/dL (ref 3.5–5.2)
Alkaline Phosphatase: 92 U/L (ref 39–117)
BUN: 11 mg/dL (ref 6–23)
CO2: 27 meq/L (ref 19–32)
Calcium: 9.2 mg/dL (ref 8.4–10.5)
Chloride: 104 meq/L (ref 96–112)
Creatinine, Ser: 0.75 mg/dL (ref 0.40–1.20)
GFR: 94.54 mL/min (ref >60.00)
Glucose, Bld: 126 mg/dL — ABNORMAL HIGH (ref 70–99)
Potassium: 3.9 meq/L (ref 3.5–5.1)
Sodium: 141 meq/L (ref 135–145)
Total Bilirubin: 0.5 mg/dL (ref 0.2–1.2)
Total Protein: 7.4 g/dL (ref 6.0–8.3)

## 2024-01-27 LAB — MICROALBUMIN / CREATININE URINE RATIO
Creatinine,U: 30.5 mg/dL
Microalb Creat Ratio: UNDETERMINED mg/g (ref 0.0–30.0)
Microalb, Ur: <0.7 mg/dL

## 2024-01-27 LAB — LIPID PANEL
Cholesterol: 109 mg/dL (ref 0–200)
HDL: 59 mg/dL (ref >39.00)
LDL Cholesterol: 40 mg/dL (ref 0–99)
NonHDL: 50.41
Total CHOL/HDL Ratio: 2
Triglycerides: 52 mg/dL (ref 0.0–149.0)
VLDL: 10.4 mg/dL (ref 0.0–40.0)

## 2024-01-27 LAB — HEMOGLOBIN A1C: Hgb A1c MFr Bld: 6.6 % — ABNORMAL HIGH (ref 4.6–6.5)

## 2024-01-27 MED ORDER — TIRZEPATIDE 2.5 MG/0.5ML ~~LOC~~ SOAJ: 2.5000 mg | SUBCUTANEOUS | 2 refills | Status: DC

## 2024-01-27 NOTE — Telephone Encounter (Signed)
 Please call [facility] to request mammogram.

## 2024-01-27 NOTE — Assessment & Plan Note (Signed)
 Lab Results  Component Value Date   CHOL 111 10/06/2022   HDL 57.70 10/06/2022   LDLCALC 46 10/06/2022   TRIG 35.0 10/06/2022   CHOLHDL 2 10/06/2022   Continues atorvastatin . Update lipid panel.

## 2024-01-27 NOTE — Assessment & Plan Note (Signed)
 Update Urine microalbumin today.  Continue Ace.

## 2024-01-27 NOTE — Assessment & Plan Note (Addendum)
 Lab Results  Component Value Date   HGBA1C 6.2 06/09/2023   HGBA1C 5.8 02/06/2023   HGBA1C 6.1 10/06/2022   Lab Results  Component Value Date   LDLCALC 46 10/06/2022   CREATININE 0.77 06/09/2023   She has had significant weight gain.  Interested in GLP-1. Discussed risks/common side effects.  Will initiate Mounjaro  2.5mg  weekly.

## 2024-01-27 NOTE — Patient Instructions (Signed)
 VISIT SUMMARY:  Today, we discussed your ongoing management of diabetes, weight loss challenges, and other health concerns. We decided to start a new medication, Mounjaro , to help with both blood sugar control and weight loss.  YOUR PLAN:  TYPE 2 DIABETES MELLITUS: You have diabetes and are having trouble losing weight despite making healthy lifestyle changes. -Start Mounjaro  (tirzepatide ) subcutaneous injection once weekly. -Get a copay card for Mounjaro  online. -Watch for side effects like reflux, constipation, and nausea. -Check urine microalbumin.  OBESITY: You are experiencing difficulty losing weight. -Start Mounjaro  (tirzepatide ) subcutaneous injection once weekly. -Get a copay card for Mounjaro  online. -Watch for side effects like reflux, constipation, and nausea.   HYPERLIPIDEMIA: Your cholesterol levels were last checked over a year ago and are managed with atorvastatin . -Get a lipid panel test.

## 2024-01-27 NOTE — Progress Notes (Signed)
 Subjective:     Patient ID: Rose Hansen, female    DOB: 05-Nov-1975, 48 y.o.   MRN: [id number]  Chief Complaint  Patient presents with   Diabetes    Here for follow up    Diabetes    Discussed the use of AI scribe software for clinical note transcription with the patient, who gave verbal consent to proceed.  History of Present Illness Rose Hansen is a 48 year old female with diabetes who presents for routine follow-up.  She is experiencing difficulty losing weight despite healthy lifestyle changes, including exercising four days a week, drinking only water, and controlling portion sizes with meals. Her current medications include atorvastatin  and lisinopril . She has not used GLP-1 injections like Ozempic for diabetes management.      Health Maintenance Due  Topic Date Due   Diabetic kidney evaluation - Urine ACR  Never done   Hepatitis B Vaccines 19-59 Average Risk (1 of 3 - 19+ 3-dose series) Never done   COVID-19 Vaccine (7 - 2024-25 season) February 23, 2023   MAMMOGRAM  07/01/2023   HEMOGLOBIN A1C  12/07/2023   INFLUENZA VACCINE  01/01/2024    Past Medical History:  Diagnosis Date   Abnormal glucose    Circadian rhythm sleep disorder, shift work type    Diabetes type 2, controlled (HCC) 08/15/2016   Fatty liver 08/22/2016   Fibromyalgia    History of UTI    Hyperlipidemia    Mild anemia    Nonspecific reaction to tuberculin skin test without active tuberculosis(795.51)     Past Surgical History:  Procedure Laterality Date   ABDOMINAL HYSTERECTOMY N/A 12/07/2018   gyn surgery  08/2007   Dr. Rosalynn for benign cystic teratomas of right ovary,uterine fibroids, follicular cyst of left ovary   TOTAL ABDOMINAL HYSTERECTOMY  12/07/2018   WISDOM TOOTH EXTRACTION      Family History  Problem Relation Age of Onset   Alzheimer's disease Mother 11       end stage dementia   Hypertension Father        (father was adopted)   Colon polyps Father    Asthma  Sister    Hypertension Sister    Diabetes Mellitus II Maternal Grandmother    CVA Maternal Grandmother    Colon cancer Maternal Grandfather 2044/02/23   Diabetes Mellitus II Maternal Grandfather    Esophageal cancer Neg Hx    Stomach cancer Neg Hx    Rectal cancer Neg Hx     Social History   Socioeconomic History   Marital status: Single    Spouse name: Not on file   Number of children: 0   Years of education: Not on file   Highest education level: Bachelor's degree (e.g., BA, AB, BS)  Occupational History   Occupation: dialysis nurse at [employer]  Tobacco Use   Smoking status: Never   Smokeless tobacco: Never  Vaping Use   Vaping status: Never Used  Substance and Sexual Activity   Alcohol use: No   Drug use: No   Sexual activity: Not Currently  Other Topics Concern   Not on file  Social History Narrative   Mom- alzheimers- passed away Feb 22, 2018   Single   Dialysis RN- clinical coordinator (works days)    Completed college   No children   Enjoys reading and spending time with nieces and nephews   Social Drivers of Corporate investment banker Strain: Low Risk  (06/09/2023)   Overall Financial Resource Strain (CARDIA)  Difficulty of Paying Living Expenses: Not hard at all  Food Insecurity: No Food Insecurity (06/09/2023)   Hunger Vital Sign    Worried About Running Out of Food in the Last Year: Never true    Ran Out of Food in the Last Year: Never true  Transportation Needs: No Transportation Needs (06/09/2023)   PRAPARE - Administrator, Civil Service (Medical): No    Lack of Transportation (Non-Medical): No  Physical Activity: Insufficiently Active (06/09/2023)   Exercise Vital Sign    Days of Exercise per Week: 3 days    Minutes of Exercise per Session: 30 min  Stress: No Stress Concern Present (06/09/2023)   [employer] of Occupational Health - Occupational Stress Questionnaire    Feeling of Stress : Not at all  Social Connections: Moderately Isolated  (06/09/2023)   Social Connection and Isolation Panel    Frequency of Communication with Friends and Family: More than three times a week    Frequency of Social Gatherings with Friends and Family: Twice a week    Attends Religious Services: More than 4 times per year    Active Member of [employer] or Organizations: No    Attends Engineer, structural: Not on file    Marital Status: Never married  Catering manager Violence: Not on file    Outpatient Medications Prior to Visit  Medication Sig Dispense Refill   atorvastatin  (LIPITOR) 10 MG tablet TAKE 1 TABLET BY MOUTH EVERY DAY 90 tablet 1   lisinopril  (ZESTRIL ) 2.5 MG tablet TAKE 1 TABLET BY MOUTH EVERY DAY 90 tablet 1   No facility-administered medications prior to visit.    Allergies  Allergen Reactions   Pylera [Bis Subcit-Metronid-Tetracyc] Swelling    Swelling of face, eyes, lips.   Bismuth-Containing Compounds Swelling   Metronidazole Swelling   Tetracyclines & Related Swelling    ROS    See HPI Objective:    Physical Exam Constitutional:      General: She is not in acute distress.    Appearance: Normal appearance. She is well-developed.  HENT:     Head: Normocephalic and atraumatic.     Right Ear: External ear normal.     Left Ear: External ear normal.  Eyes:     General: No scleral icterus. Neck:     Thyroid : No thyromegaly.  Cardiovascular:     Rate and Rhythm: Normal rate and regular rhythm.     Heart sounds: Normal heart sounds. No murmur heard. Pulmonary:     Effort: Pulmonary effort is normal. No respiratory distress.     Breath sounds: Normal breath sounds. No wheezing.  Musculoskeletal:     Cervical back: Neck supple.  Skin:    General: Skin is warm and dry.  Neurological:     Mental Status: She is alert and oriented to person, place, and time.  Psychiatric:        Mood and Affect: Mood normal.        Behavior: Behavior normal.        Thought Content: Thought content normal.        Judgment:  Judgment normal.    Diabetic Foot Exam - Simple   Simple Foot Form Diabetic Foot exam was performed with the following findings: Yes 01/27/2024  8:23 AM  Visual Inspection No deformities, no ulcerations, no other skin breakdown bilaterally: Yes Sensation Testing Intact to touch and monofilament testing bilaterally: Yes Pulse Check Posterior Tibialis and Dorsalis pulse intact bilaterally: Yes Comments  BP 134/80 (BP Location: Right Arm, Patient Position: Sitting, Cuff Size: Normal)   Pulse 79   Temp 98.4 F (36.9 C) (Oral)   Resp 16   Ht 4' 11 (1.499 m)   Wt 168 lb (76.2 kg)   LMP 11/22/2018   SpO2 100%   BMI 33.93 kg/m  Wt Readings from Last 3 Encounters:  01/27/24 168 lb (76.2 kg)  06/09/23 156 lb (70.8 kg)  02/06/23 151 lb 3.2 oz (68.6 kg)       Assessment & Plan:   Problem List Items Addressed This Visit       Unprioritized   Type 2 diabetes mellitus with other specified complication (HCC) - Primary   Lab Results  Component Value Date   HGBA1C 6.2 06/09/2023   HGBA1C 5.8 02/06/2023   HGBA1C 6.1 10/06/2022   Lab Results  Component Value Date   LDLCALC 46 10/06/2022   CREATININE 0.77 06/09/2023   She has had significant weight gain.  Interested in GLP-1. Discussed risks/common side effects.  Will initiate Mounjaro  2.5mg  weekly.        Relevant Medications   tirzepatide  (MOUNJARO ) 2.5 MG/0.5ML Pen   Other Relevant Orders   HgB A1c   Urine Microalbumin w/creat. ratio   Comp Met (CMET)   Microalbuminuria   Update Urine microalbumin today.  Continue Ace.       Hyperlipidemia associated with type 2 diabetes mellitus (HCC)   Lab Results  Component Value Date   CHOL 111 10/06/2022   HDL 57.70 10/06/2022   LDLCALC 46 10/06/2022   TRIG 35.0 10/06/2022   CHOLHDL 2 10/06/2022   Continues atorvastatin . Update lipid panel.       Relevant Medications   tirzepatide  (MOUNJARO ) 2.5 MG/0.5ML Pen   Other Relevant Orders   Lipid panel    I am  having Grettel A. Lehane start on tirzepatide . I am also having her maintain her atorvastatin  and lisinopril .  Meds ordered this encounter  Medications   tirzepatide  (MOUNJARO ) 2.5 MG/0.5ML Pen    Sig: Inject 2.5 mg into the skin once a week.    Dispense:  2 mL    Refill:  2    Supervising Provider:   DOMENICA BLACKBIRD A [[id number]]

## 2024-01-28 NOTE — Telephone Encounter (Signed)
 Electronic request sent

## 2024-01-29 ENCOUNTER — Ambulatory Visit: Payer: Self-pay | Admitting: Family

## 2024-02-09 ENCOUNTER — Encounter: Payer: Self-pay | Admitting: *Deleted

## 2024-03-29 ENCOUNTER — Other Ambulatory Visit: Payer: Self-pay | Admitting: Family

## 2024-04-14 ENCOUNTER — Other Ambulatory Visit: Payer: Self-pay | Admitting: Family

## 2024-04-14 DIAGNOSIS — E1169 Type 2 diabetes mellitus with other specified complication: Secondary | ICD-10-CM

## 2024-04-27 ENCOUNTER — Ambulatory Visit: Admitting: Family

## 2024-04-27 VITALS — BP 122/82 | HR 84 | Temp 98.4°F | Resp 16 | Ht 59.0 in | Wt 151.0 lb

## 2024-04-27 DIAGNOSIS — E785 Hyperlipidemia, unspecified: Secondary | ICD-10-CM

## 2024-04-27 DIAGNOSIS — Z7985 Long-term (current) use of injectable non-insulin antidiabetic drugs: Secondary | ICD-10-CM

## 2024-04-27 DIAGNOSIS — R3129 Other microscopic hematuria: Secondary | ICD-10-CM

## 2024-04-27 DIAGNOSIS — E1169 Type 2 diabetes mellitus with other specified complication: Secondary | ICD-10-CM

## 2024-04-27 DIAGNOSIS — R809 Proteinuria, unspecified: Secondary | ICD-10-CM

## 2024-04-27 LAB — URINALYSIS, ROUTINE W REFLEX MICROSCOPIC
Bilirubin Urine: NEGATIVE
Ketones, ur: NEGATIVE
Leukocytes,Ua: NEGATIVE
Nitrite: NEGATIVE
Specific Gravity, Urine: <=1.005 — AB (ref 1.000–1.030)
Total Protein, Urine: NEGATIVE
Urine Glucose: NEGATIVE
Urobilinogen, UA: 0.2 (ref 0.0–1.0)
WBC, UA: NONE SEEN (ref 0–?)
pH: 6 (ref 5.0–8.0)

## 2024-04-27 LAB — HEMOGLOBIN A1C: Hgb A1c MFr Bld: 5.4 % (ref 4.6–6.5)

## 2024-04-27 LAB — BASIC METABOLIC PANEL WITH GFR
BUN: 13 mg/dL (ref 6–23)
CO2: 26 meq/L (ref 19–32)
Calcium: 9.6 mg/dL (ref 8.4–10.5)
Chloride: 105 meq/L (ref 96–112)
Creatinine, Ser: 0.78 mg/dL (ref 0.40–1.20)
GFR: 90.04 mL/min (ref >60.00)
Glucose, Bld: 87 mg/dL (ref 70–99)
Potassium: 4 meq/L (ref 3.5–5.1)
Sodium: 139 meq/L (ref 135–145)

## 2024-04-27 NOTE — Assessment & Plan Note (Addendum)
 Lab Results  Component Value Date   HGBA1C 6.6 (H) 01/27/2024   HGBA1C 6.2 06/09/2023   HGBA1C 5.8 02/06/2023   Lab Results  Component Value Date   MICROALBUR <0.7 01/27/2024   LDLCALC 40 01/27/2024   CREATININE 0.75 01/27/2024   17 lb weight loss since beginning Mounjaro . Will continue the low dose mounjaro  and update A1C.

## 2024-04-27 NOTE — Progress Notes (Signed)
 Subjective:     Patient ID: Rose Hansen, female    DOB: November 20, 1975, 48 y.o.   MRN: [id number]  Chief Complaint  Patient presents with   Diabetes    Here  for follow up     HPI  Discussed the use of AI scribe software for clinical note transcription with the patient, who gave verbal consent to proceed.  History of Present Illness Rose Hansen is a 48 year old female with type 2 diabetes and fatty liver disease who presents for follow-up on weight management and diabetes control.  She has experienced a weight loss of 17 pounds since starting a GLP-1 receptor agonist, Mounjaro , at a dose of 2.5 mg. She feels significantly better and is considering increasing the dose only if her weight stabilizes or increases.  Her last A1c was 6.6%, and she anticipates it will be lower at the next check. She maintains a healthy diet and exercises regularly.  She is currently taking atorvastatin . Her lisinopril  was checked over the summer and showed good results, and she continues to take it for kidney protection.  She has a history of fatty liver disease, and there is a new indication that GLP-1 medications may help with this condition as well.  She received her flu shot in September.      Health Maintenance Due  Topic Date Due   COVID-19 Vaccine (7 - 2025-26 season) 02/01/2024    Past Medical History:  Diagnosis Date   Abnormal glucose    Circadian rhythm sleep disorder, shift work type    Diabetes type 2, controlled (HCC) 08/15/2016   Fatty liver 08/22/2016   Fibromyalgia    History of UTI    Hyperlipidemia    Mild anemia    Nonspecific reaction to tuberculin skin test without active tuberculosis(795.51)     Past Surgical History:  Procedure Laterality Date   ABDOMINAL HYSTERECTOMY N/A 12/07/2018   gyn surgery  08/2007   Dr. Rosalynn for benign cystic teratomas of right ovary,uterine fibroids, follicular cyst of left ovary   TOTAL ABDOMINAL HYSTERECTOMY  12/07/2018    WISDOM TOOTH EXTRACTION      Family History  Problem Relation Age of Onset   Alzheimer's disease Mother 54       end stage dementia   Hypertension Father        (father was adopted)   Colon polyps Father    Asthma Sister    Hypertension Sister    Diabetes Mellitus II Maternal Grandmother    CVA Maternal Grandmother    Colon cancer Maternal Grandfather 05-17-44   Diabetes Mellitus II Maternal Grandfather    Esophageal cancer Neg Hx    Stomach cancer Neg Hx    Rectal cancer Neg Hx     Social History   Socioeconomic History   Marital status: Single    Spouse name: Not on file   Number of children: 0   Years of education: Not on file   Highest education level: Bachelor's degree (e.g., BA, AB, BS)  Occupational History   Occupation: dialysis nurse at [employer]  Tobacco Use   Smoking status: Never   Smokeless tobacco: Never  Vaping Use   Vaping status: Never Used  Substance and Sexual Activity   Alcohol use: No   Drug use: No   Sexual activity: Not Currently  Other Topics Concern   Not on file  Social History Narrative   Mom- alzheimers- passed away 05/17/2018   Single   Dialysis RN-  clinical coordinator (works days)    Completed college   No children   Enjoys reading and spending time with nieces and nephews   Social Drivers of Corporate Investment Banker Strain: Low Risk  (04/27/2024)   Overall Financial Resource Strain (CARDIA)    Difficulty of Paying Living Expenses: Not hard at all  Food Insecurity: No Food Insecurity (04/27/2024)   Hunger Vital Sign    Worried About Running Out of Food in the Last Year: Never true    Ran Out of Food in the Last Year: Never true  Transportation Needs: No Transportation Needs (04/27/2024)   PRAPARE - Administrator, Civil Service (Medical): No    Lack of Transportation (Non-Medical): No  Physical Activity: Insufficiently Active (04/27/2024)   Exercise Vital Sign    Days of Exercise per Week: 3 days    Minutes of  Exercise per Session: 30 min  Stress: No Stress Concern Present (04/27/2024)   [employer] of Occupational Health - Occupational Stress Questionnaire    Feeling of Stress: Only a little  Social Connections: Moderately Integrated (04/27/2024)   Social Connection and Isolation Panel    Frequency of Communication with Friends and Family: Three times a week    Frequency of Social Gatherings with Friends and Family: Twice a week    Attends Religious Services: More than 4 times per year    Active Member of [employer] or Organizations: Yes    Attends Banker Meetings: 1 to 4 times per year    Marital Status: Never married  Catering Manager Violence: Not on file    Outpatient Medications Prior to Visit  Medication Sig Dispense Refill   atorvastatin  (LIPITOR) 10 MG tablet TAKE 1 TABLET BY MOUTH EVERY DAY 90 tablet 1   lisinopril  (ZESTRIL ) 2.5 MG tablet TAKE 1 TABLET BY MOUTH EVERY DAY 90 tablet 1   tirzepatide  (MOUNJARO ) 2.5 MG/0.5ML Pen INJECT 2.5 MG SUBCUTANEOUSLY WEEKLY 2 mL 2   No facility-administered medications prior to visit.    Allergies  Allergen Reactions   Pylera [Bis Subcit-Metronid-Tetracyc] Swelling    Swelling of face, eyes, lips.   Bismuth-Containing Compounds Swelling   Metronidazole Swelling   Tetracyclines & Related Swelling    ROS See HPI    Objective:    Physical Exam Constitutional:      General: She is not in acute distress.    Appearance: Normal appearance. She is well-developed.  HENT:     Head: Normocephalic and atraumatic.     Right Ear: External ear normal.     Left Ear: External ear normal.  Eyes:     General: No scleral icterus. Neck:     Thyroid : No thyromegaly.  Cardiovascular:     Rate and Rhythm: Normal rate and regular rhythm.     Heart sounds: Normal heart sounds. No murmur heard. Pulmonary:     Effort: Pulmonary effort is normal. No respiratory distress.     Breath sounds: Normal breath sounds. No wheezing.   Musculoskeletal:     Cervical back: Neck supple.  Skin:    General: Skin is warm and dry.  Neurological:     Mental Status: She is alert and oriented to person, place, and time.  Psychiatric:        Mood and Affect: Mood normal.        Behavior: Behavior normal.        Thought Content: Thought content normal.        Judgment: Judgment  normal.      BP 122/82 (BP Location: Right Arm, Patient Position: Sitting, Cuff Size: Normal)   Pulse 84   Temp 98.4 F (36.9 C) (Oral)   Resp 16   Ht 4' 11 (1.499 m)   Wt 151 lb (68.5 kg)   LMP 11/22/2018   SpO2 100%   BMI 30.50 kg/m  Wt Readings from Last 3 Encounters:  04/27/24 151 lb (68.5 kg)  01/27/24 168 lb (76.2 kg)  06/09/23 156 lb (70.8 kg)       Assessment & Plan:   Problem List Items Addressed This Visit       Unprioritized   Type 2 diabetes mellitus with other specified complication (HCC) - Primary   Lab Results  Component Value Date   HGBA1C 6.6 (H) 01/27/2024   HGBA1C 6.2 06/09/2023   HGBA1C 5.8 02/06/2023   Lab Results  Component Value Date   MICROALBUR <0.7 01/27/2024   LDLCALC 40 01/27/2024   CREATININE 0.75 01/27/2024   17 lb weight loss since beginning Mounjaro . Will continue the low dose mounjaro  and update A1C.       Relevant Orders   HgB A1c   Basic Metabolic Panel (BMET)   Microalbuminuria   Continue low dose lisinopril  for renal protection.       Hyperlipidemia associated with type 2 diabetes mellitus (HCC)   Lab Results  Component Value Date   CHOL 109 01/27/2024   HDL 59.00 01/27/2024   LDLCALC 40 01/27/2024   TRIG 52.0 01/27/2024   CHOLHDL 2 01/27/2024   Lipids at goal on atorvastatin .       Other Visit Diagnoses       Microscopic hematuria       Relevant Orders   Urinalysis, Routine w reflex microscopic       I am having Charon A. Nathaniel maintain her lisinopril , atorvastatin , and Mounjaro .  No orders of the defined types were placed in this encounter.

## 2024-04-27 NOTE — Assessment & Plan Note (Signed)
 Lab Results  Component Value Date   CHOL 109 01/27/2024   HDL 59.00 01/27/2024   LDLCALC 40 01/27/2024   TRIG 52.0 01/27/2024   CHOLHDL 2 01/27/2024   Lipids at goal on atorvastatin .

## 2024-04-27 NOTE — Patient Instructions (Signed)
  VISIT SUMMARY: Today, we reviewed your progress with weight management and diabetes control. You have lost 17 pounds since starting Mounjaro  and are feeling better. Your last A1c was 6.6%, and you are maintaining a healthy diet and regular exercise. We also discussed your management of mixed hyperlipidemia and fatty liver disease.  YOUR PLAN: -TYPE 2 DIABETES MELLITUS WITH OTHER SPECIFIED COMPLICATION: Type 2 diabetes is a condition where your body does not use insulin properly, leading to high blood sugar levels. Your diabetes is well-controlled with the GLP-1 receptor agonist, Mounjaro , which has also helped you lose weight. Continue taking Mounjaro  at 2.5 mg. We have ordered an A1c test to monitor your blood sugar levels. If your weight stabilizes and your appetite increases, we may consider increasing the dose to 5 mg.  -MIXED HYPERLIPIDEMIA ASSOCIATED WITH TYPE 2 DIABETES MELLITUS: Mixed hyperlipidemia is a condition where you have high levels of different types of fats in your blood, which can increase the risk of heart disease. This is being managed with atorvastatin  and lisinopril . Continue taking atorvastatin  10 mg daily and lisinopril  2.5 mg daily. We have ordered a basic metabolic panel to check your kidney function.  -FATTY LIVER DISEASE: Fatty liver disease is a condition where fat builds up in your liver. The GLP-1 receptor agonist therapy you are on may also help with this condition. Continue taking your current medication.  -GENERAL HEALTH MAINTENANCE: You received your flu shot in September and your hepatitis B vaccination status is confirmed. Continue maintaining a healthy lifestyle with a balanced diet and regular exercise.  INSTRUCTIONS: We have ordered an A1c test to monitor your blood sugar levels and a basic metabolic panel to check your kidney function. Please follow up with these tests as instructed.

## 2024-04-27 NOTE — Assessment & Plan Note (Signed)
 Continue low dose lisinopril  for renal protection.

## 2024-04-30 ENCOUNTER — Ambulatory Visit: Payer: Self-pay | Admitting: Family

## 2024-05-27 ENCOUNTER — Other Ambulatory Visit: Payer: Self-pay | Admitting: Family

## 2024-07-03 ENCOUNTER — Other Ambulatory Visit: Payer: Self-pay | Admitting: Family

## 2024-07-03 DIAGNOSIS — E1169 Type 2 diabetes mellitus with other specified complication: Secondary | ICD-10-CM

## 2024-07-29 ENCOUNTER — Ambulatory Visit: Admitting: Family
